# Patient Record
Sex: Female | Born: 2000 | Race: Black or African American | Hispanic: No | Marital: Single | Smoking: Never smoker
Health system: Southern US, Community
[De-identification: ages and names within clinical notes are randomized; demographics above are authoritative.]

## PROBLEM LIST (undated history)

## (undated) DIAGNOSIS — J45909 Unspecified asthma, uncomplicated: Secondary | ICD-10-CM

## (undated) HISTORY — DX: Unspecified asthma, uncomplicated: J45.909

---

## 2021-10-04 ENCOUNTER — Emergency Department (HOSPITAL_COMMUNITY)
Admission: EM | Admit: 2021-10-04 | Discharge: 2021-10-04 | Disposition: A | Discharge status: Home / Self Care | Payer: Medicaid Other | Attending: Emergency Medicine | Admitting: Emergency Medicine

## 2021-10-04 ENCOUNTER — Emergency Department (HOSPITAL_COMMUNITY): Payer: Medicaid Other

## 2021-10-04 ENCOUNTER — Encounter (HOSPITAL_COMMUNITY): Payer: Self-pay

## 2021-10-04 ENCOUNTER — Other Ambulatory Visit: Payer: Self-pay

## 2021-10-04 DIAGNOSIS — R42 Dizziness and giddiness: Secondary | ICD-10-CM | POA: Insufficient documentation

## 2021-10-04 DIAGNOSIS — R519 Headache, unspecified: Secondary | ICD-10-CM | POA: Insufficient documentation

## 2021-10-04 DIAGNOSIS — R030 Elevated blood-pressure reading, without diagnosis of hypertension: Secondary | ICD-10-CM

## 2021-10-04 MED ORDER — IBUPROFEN 200 MG PO TABS
400.0000 mg | ORAL_TABLET | Freq: Once | ORAL | Status: AC
  Administered 2021-10-04: 400 mg via ORAL
  Filled 2021-10-04: qty 2

## 2021-10-04 MED ORDER — ACETAMINOPHEN 500 MG PO TABS
1000.0000 mg | ORAL_TABLET | Freq: Once | ORAL | Status: AC
  Administered 2021-10-04: 1000 mg via ORAL
  Filled 2021-10-04: qty 2

## 2021-10-04 NOTE — Discharge Instructions (Addendum)
It was our pleasure to provide your ER care today - we hope that you feel better.  Our radiologist looked at your head ct and indicates it looks good - no acute abnormality was noted.   Drink plenty of fluids/stay adequately hydrated.   Take excedrin or acetaminophen as need.   Follow up with primary care doctor/neurologist in the coming week.  Also follow up with primary care doctor then for blood pressure which is high tonight.   Return to ER if worse, new symptoms, severe pain, persistent vomiting, numbness/weakness, fainting, fevers, or other concern.

## 2021-10-04 NOTE — ED Triage Notes (Signed)
Pt reports having a headache x 2 months to the front and left side of her head. Pt states that she became dizzy today and fell.

## 2021-10-04 NOTE — ED Provider Notes (Signed)
[facility]-EMERGENCY DEPT Provider Note   CSN: [id number] Arrival date & time: 10/04/21  2014     History Chief Complaint  Patient presents with   Headache    Vanessa Kelley is a 20 y.o. female.  Patient c/o intermittent headaches in the past two months, almost daily. Symptoms gradual onset, moderate, dull, diffuse/frontal, non radiating.  Headaches occur randomly, at rest, occasionally in AM, but also at other times, almost daily. ?hx migraines, but this frequency is unusual for her - occasional headaches in past. No acute, abrupt, or 'worst headache'. No neck pain or stiffness. No eye pain or change in vision. No associated numbness or weakness. No loss of normal functional ability. No vomiting. No sinus drainage, congestion or pain, no sore throat or cough. No fever.chills or sweats.  States mildly lightheaded earlier - denies head trauma or syncope. No vertigo/room spinning or problems w gait.   The history is provided by the patient and medical records.  Headache Associated symptoms: no abdominal pain, no eye pain, no fever, no nausea, no neck pain, no neck stiffness, no numbness, no photophobia, no sinus pressure, no sore throat, no vomiting and no weakness   Fall Associated symptoms include headaches. Pertinent negatives include no chest pain, no abdominal pain and no shortness of breath.      History reviewed. No pertinent past medical history.  There are no problems to display for this patient.   History reviewed. No pertinent surgical history.   OB History   No obstetric history on file.     History reviewed. No pertinent family history.     Home Medications Prior to Admission medications   Not on File    Allergies    Patient has no allergy information on record.  Review of Systems   Review of Systems  Constitutional:  Negative for chills and fever.  HENT:  Negative for sinus pressure, sinus pain and sore throat.   Eyes:   Negative for photophobia, pain, redness and visual disturbance.  Respiratory:  Negative for shortness of breath.   Cardiovascular:  Negative for chest pain.  Gastrointestinal:  Negative for abdominal pain, nausea and vomiting.  Genitourinary:  Negative for dysuria and flank pain.  Musculoskeletal:  Negative for neck pain and neck stiffness.  Skin:  Negative for rash.  Neurological:  Positive for headaches. Negative for speech difficulty, weakness and numbness.  Hematological:  Does not bruise/bleed easily.  Psychiatric/Behavioral:  Negative for confusion.    Physical Exam Updated Vital Signs BP (!) 164/87 (BP Location: Left Arm)   Pulse 72   Temp 97.9 F (36.6 C) (Oral)   Resp 17   Ht 1.702 m (5\' 7" )   Wt 117 kg   SpO2 100%   BMI 40.41 kg/m   Physical Exam Vitals and nursing note reviewed.  Constitutional:      Appearance: Normal appearance. She is well-developed.  HENT:     Head: Atraumatic.     Comments: No sinus or temporal tenderness. No mastoid tenderness.     Right Ear: Tympanic membrane normal.     Left Ear: Tympanic membrane normal.     Nose: Nose normal.     Mouth/Throat:     Mouth: Mucous membranes are moist.  Eyes:     General: No scleral icterus.    Extraocular Movements: Extraocular movements intact.     Conjunctiva/sclera: Conjunctivae normal.     Pupils: Pupils are equal, round, and reactive to light.  Neck:  Vascular: No carotid bruit.     Trachea: No tracheal deviation.     Comments: No stiffness or rigidity. Trachea midline. No mass.  Cardiovascular:     Rate and Rhythm: Normal rate and regular rhythm.     Pulses: Normal pulses.     Heart sounds: Normal heart sounds. No murmur heard.   No friction rub. No gallop.  Pulmonary:     Effort: Pulmonary effort is normal. No respiratory distress.     Breath sounds: Normal breath sounds.  Abdominal:     General: Bowel sounds are normal. There is no distension.     Palpations: Abdomen is soft.      Tenderness: There is no abdominal tenderness.  Genitourinary:    Comments: No cva tenderness.  Musculoskeletal:        General: No swelling.     Cervical back: Normal range of motion and neck supple. No rigidity. No muscular tenderness.     Right lower leg: No edema.     Left lower leg: No edema.  Skin:    General: Skin is warm and dry.     Findings: No rash.  Neurological:     Mental Status: She is alert.     Comments: Alert, speech normal/fluent, no dysarthria or aphasia. Motor fxn grossly intact bil, stre 5/5. Sens grossly intact. Steady gait, no ataxia noted. No faintness noted.   Psychiatric:        Mood and Affect: Mood normal.    ED Results / Procedures / Treatments   Labs (all labs ordered are listed, but only abnormal results are displayed) Labs Reviewed - No data to display  EKG None  Radiology CT Head Wo Contrast  Result Date: 10/04/2021 CLINICAL DATA:  Headache EXAM: CT HEAD WITHOUT CONTRAST TECHNIQUE: Contiguous axial images were obtained from the base of the skull through the vertex without intravenous contrast. COMPARISON:  None. FINDINGS: Brain: No evidence of acute infarction, hemorrhage, hydrocephalus, extra-axial collection or mass lesion/mass effect. Vascular: No hyperdense vessel or unexpected calcification. Skull: Normal. Negative for fracture or focal lesion. Sinuses/Orbits: The visualized paranasal sinuses are essentially clear. The mastoid air cells are unopacified. Other: None. IMPRESSION: Normal head CT. Electronically Signed   By: Julian Hy M.D.   On: 10/04/2021 21:28    Procedures Procedures   Medications Ordered in ED Medications - No data to display  ED Course  I have reviewed the triage vital signs and the nursing notes.  Pertinent labs & imaging results that were available during my care of the patient were reviewed by me and considered in my medical decision making (see chart for details).    MDM Rules/Calculators/A&P                           Imaging ordered.   Reviewed nursing notes and prior charts for additional history.   No meds pta tonight. Acetaminophen po, ibuprofen po. Po fluids.   CT reviewed/interpreted by me - no hem.   Pt currently appears stable for d/c.  Rec pcp/neuro f/u.   Return precautions provided.      Final Clinical Impression(s) / ED Diagnoses Final diagnoses:  None    Rx / DC Orders ED Discharge Orders     None        Lajean Saver, MD 10/04/21 2146

## 2021-10-19 ENCOUNTER — Emergency Department (HOSPITAL_COMMUNITY)
Admission: EM | Admit: 2021-10-19 | Discharge: 2021-10-19 | Disposition: A | Discharge status: Home / Self Care | Payer: Medicaid Other | Attending: Emergency Medicine | Admitting: Emergency Medicine

## 2021-10-19 ENCOUNTER — Other Ambulatory Visit: Payer: Self-pay

## 2021-10-19 DIAGNOSIS — R1013 Epigastric pain: Secondary | ICD-10-CM

## 2021-10-19 DIAGNOSIS — R112 Nausea with vomiting, unspecified: Secondary | ICD-10-CM | POA: Insufficient documentation

## 2021-10-19 DIAGNOSIS — K5900 Constipation, unspecified: Secondary | ICD-10-CM

## 2021-10-19 DIAGNOSIS — R110 Nausea: Secondary | ICD-10-CM

## 2021-10-19 LAB — COMPREHENSIVE METABOLIC PANEL
ALT: 12 U/L (ref 0–44)
AST: 15 U/L (ref 15–41)
Albumin: 3.2 g/dL — ABNORMAL LOW (ref 3.5–5.0)
Alkaline Phosphatase: 77 U/L (ref 38–126)
Anion gap: 8 (ref 5–15)
BUN: 10 mg/dL (ref 6–20)
CO2: 25 mmol/L (ref 22–32)
Calcium: 8.6 mg/dL — ABNORMAL LOW (ref 8.9–10.3)
Chloride: 104 mmol/L (ref 98–111)
Creatinine, Ser: 0.94 mg/dL (ref 0.44–1.00)
GFR, Estimated: >60 mL/min (ref >60)
Glucose, Bld: 133 mg/dL — ABNORMAL HIGH (ref 70–99)
Potassium: 3.5 mmol/L (ref 3.5–5.1)
Sodium: 137 mmol/L (ref 135–145)
Total Bilirubin: 0.5 mg/dL (ref 0.3–1.2)
Total Protein: 6.5 g/dL (ref 6.5–8.1)

## 2021-10-19 LAB — CBC
HCT: 39.3 % (ref 36.0–46.0)
Hemoglobin: 12.8 g/dL (ref 12.0–15.0)
MCH: 28.6 pg (ref 26.0–34.0)
MCHC: 32.6 g/dL (ref 30.0–36.0)
MCV: 87.7 fL (ref 80.0–100.0)
Platelets: 246 10*3/uL (ref 150–400)
RBC: 4.48 MIL/uL (ref 3.87–5.11)
RDW: 11.7 % (ref 11.5–15.5)
WBC: 6.9 10*3/uL (ref 4.0–10.5)
nRBC: 0 % (ref 0.0–0.2)

## 2021-10-19 LAB — URINALYSIS, ROUTINE W REFLEX MICROSCOPIC
Bilirubin Urine: NEGATIVE
Glucose, UA: NEGATIVE mg/dL
Hgb urine dipstick: NEGATIVE
Ketones, ur: NEGATIVE mg/dL
Nitrite: NEGATIVE
Protein, ur: 30 mg/dL — AB
Specific Gravity, Urine: 1.029 (ref 1.005–1.030)
pH: 5 (ref 5.0–8.0)

## 2021-10-19 LAB — I-STAT BETA HCG BLOOD, ED (MC, WL, AP ONLY): I-stat hCG, quantitative: <5 m[IU]/mL (ref <5)

## 2021-10-19 LAB — LIPASE, BLOOD: Lipase: 30 U/L (ref 11–51)

## 2021-10-19 MED ORDER — LACTATED RINGERS IV BOLUS
1000.0000 mL | Freq: Once | INTRAVENOUS | Status: AC
  Administered 2021-10-19: 1000 mL via INTRAVENOUS

## 2021-10-19 MED ORDER — ONDANSETRON 4 MG PO TBDP: 4.0000 mg | ORAL_TABLET | Freq: Three times a day (TID) | ORAL | 0 refills | Status: AC | PRN

## 2021-10-19 MED ORDER — PANTOPRAZOLE SODIUM 40 MG IV SOLR
40.0000 mg | Freq: Once | INTRAVENOUS | Status: AC
  Administered 2021-10-19: 40 mg via INTRAVENOUS
  Filled 2021-10-19: qty 40

## 2021-10-19 MED ORDER — ONDANSETRON HCL 4 MG/2ML IJ SOLN
4.0000 mg | Freq: Once | INTRAMUSCULAR | Status: AC
  Administered 2021-10-19: 4 mg via INTRAVENOUS
  Filled 2021-10-19: qty 2

## 2021-10-19 MED ORDER — PANTOPRAZOLE SODIUM 40 MG PO TBEC: 40.0000 mg | DELAYED_RELEASE_TABLET | Freq: Every day | ORAL | 0 refills | Status: DC

## 2021-10-19 MED ORDER — SENNOSIDES-DOCUSATE SODIUM 8.6-50 MG PO TABS: 1.0000 | ORAL_TABLET | Freq: Every evening | ORAL | 0 refills | Status: DC | PRN

## 2021-10-19 MED ORDER — DICYCLOMINE HCL 10 MG/ML IM SOLN
20.0000 mg | Freq: Once | INTRAMUSCULAR | Status: AC
  Administered 2021-10-19: 20 mg via INTRAMUSCULAR
  Filled 2021-10-19: qty 2

## 2021-10-19 MED ORDER — DICYCLOMINE HCL 20 MG PO TABS: 20.0000 mg | ORAL_TABLET | Freq: Three times a day (TID) | ORAL | 0 refills | Status: DC | PRN

## 2021-10-19 NOTE — ED Notes (Signed)
Dr Long at bedside

## 2021-10-19 NOTE — ED Notes (Signed)
Lab to add on urine culture

## 2021-10-19 NOTE — ED Notes (Signed)
Received verbal report from Erica S RN at this time 

## 2021-10-19 NOTE — Discharge Instructions (Signed)

## 2021-10-19 NOTE — ED Triage Notes (Signed)
Pt c/o abd pain, vomiting and constipation for the past 2 days.

## 2021-10-19 NOTE — ED Provider Notes (Signed)
Emergency Department Provider Note   I have reviewed the triage vital signs and the nursing notes.   HISTORY  Chief Complaint Abdominal Pain and Emesis   HPI Vanessa Kelley is a 20 y.o. female with no past surgical history presents to the emergency department with abdominal pain along with nausea and constipation.  She was recently started on doxycycline and fluconazole with concern for chlamydia infection.  She states that she has developed mainly epigastric abdominal discomfort that radiates more diffusely along with nausea and constipation.  Her last bowel movement was several days ago.  He is not having diarrhea.  She is passing flatus.  She is not having fevers.  No pain into the chest or upper respiratory infection symptoms.  She has noted some dark urine but no dysuria, hesitancy, urgency. No fever/chills or flank pain.    No past medical history on file.  There are no problems to display for this patient.   No past surgical history on file.  Allergies Patient has no known allergies.  No family history on file.  Social History    Review of Systems  Constitutional: No fever/chills Eyes: No visual changes. ENT: No sore throat. Cardiovascular: Denies chest pain. Respiratory: Denies shortness of breath. Gastrointestinal: Positive abdominal pain. Positive nausea, no vomiting.  No diarrhea. Positive constipation. Genitourinary: Negative for dysuria. Musculoskeletal: Negative for back pain. Skin: Negative for rash. Neurological: Negative for headaches, focal weakness or numbness.  10-point ROS otherwise negative.  ____________________________________________   PHYSICAL EXAM:  VITAL SIGNS: ED Triage Vitals  Enc Vitals Group     BP 10/19/21 0210 (!) 150/89     Pulse Rate 10/19/21 0210 81     Resp 10/19/21 0210 18     Temp 10/19/21 0210 98.4 F (36.9 C)     Temp src --      SpO2 10/19/21 0210 98 %     Weight 10/19/21 0214 255 lb (115.7 kg)      Height 10/19/21 0214 5\' 7"  (1.702 m)   Constitutional: Alert and oriented. Well appearing and in no acute distress. Eyes: Conjunctivae are normal.  Head: Atraumatic. Nose: No congestion/rhinnorhea. Mouth/Throat: Mucous membranes are moist.  Neck: No stridor.   Cardiovascular: Normal rate, regular rhythm. Good peripheral circulation. Grossly normal heart sounds.   Respiratory: Normal respiratory effort.  No retractions. Lungs CTAB. Gastrointestinal: Soft with mild mid-epigastric tenderness. No rebound or guarding. No RUQ tenderness. No distention.  Musculoskeletal: No lower extremity tenderness nor edema. No gross deformities of extremities. Neurologic:  Normal speech and language. No gross focal neurologic deficits are appreciated.  Skin:  Skin is warm, dry and intact. No rash noted.   ____________________________________________   LABS (all labs ordered are listed, but only abnormal results are displayed)  Labs Reviewed  COMPREHENSIVE METABOLIC PANEL - Abnormal; Notable for the following components:      Result Value   Glucose, Bld 133 (*)    Calcium 8.6 (*)    Albumin 3.2 (*)    All other components within normal limits  URINALYSIS, ROUTINE W REFLEX MICROSCOPIC - Abnormal; Notable for the following components:   Color, Urine AMBER (*)    APPearance HAZY (*)    Protein, ur 30 (*)    Leukocytes,Ua LARGE (*)    Bacteria, UA RARE (*)    All other components within normal limits  URINE CULTURE  LIPASE, BLOOD  CBC  I-STAT BETA HCG BLOOD, ED ([facility], [facility], [facility] ONLY)   ____________________________________________  RADIOLOGY  None   ____________________________________________   PROCEDURES  Procedure(s) performed:   Procedures  None  ____________________________________________   INITIAL IMPRESSION / ASSESSMENT AND PLAN / ED COURSE  Pertinent labs & imaging results that were available during my care of the patient were reviewed by me and considered in my medical  decision making (see chart for details).   Patient presents emergency department with epigastric abdominal pain along with nausea and constipation.  Question if this could be related to starting recent doxycycline for a sexually transmitted infection.  No peritonitis or lower abdominal pain or other symptoms to strongly suspect PID or TOA.  She looks very well.  She has mild hypertension but no other abnormal vital signs.  Do not have an immediate inclination to perform advanced abdominal imaging.  Plan for IV fluids, symptom management, and will review labs to guide further imaging PRN.   Lab work reviewed.  Pregnancy test is negative.  No leukocytosis.  No acute kidney injury or significant electrolyte disturbance.  UA is equivocal for infection.  She is not having any dysuria, hesitancy, urgency.  We will send this for culture and hold on antibiotics for now. On reassessment, after IV fluids and other medications she is feeling much better.  Her abdomen exam remains benign.  I do not see indication for advanced abdominal imaging at this time.  Will discharge home with supportive care medications and medication for constipation management as well as nausea and PPI medication.  Discussed PCP follow-up plan along with strict ED return precautions. Patient is comfortable with the plan at discharge and tolerating PO here.  ____________________________________________  FINAL CLINICAL IMPRESSION(S) / ED DIAGNOSES  Final diagnoses:  Epigastric pain  Constipation, unspecified constipation type  Nausea     MEDICATIONS GIVEN DURING THIS VISIT:  Medications  lactated ringers bolus 1,000 mL (1,000 mLs Intravenous New Bag/Given 10/19/21 0328)  ondansetron (ZOFRAN) injection 4 mg (4 mg Intravenous Given 10/19/21 0325)  pantoprazole (PROTONIX) injection 40 mg (40 mg Intravenous Given 10/19/21 0322)  dicyclomine (BENTYL) injection 20 mg (20 mg Intramuscular Given 10/19/21 0329)     NEW OUTPATIENT  MEDICATIONS STARTED DURING THIS VISIT:  New Prescriptions   DICYCLOMINE (BENTYL) 20 MG TABLET    Take 1 tablet (20 mg total) by mouth 3 (three) times daily as needed for spasms.   ONDANSETRON (ZOFRAN-ODT) 4 MG DISINTEGRATING TABLET    Take 1 tablet (4 mg total) by mouth every 8 (eight) hours as needed for nausea or vomiting.   PANTOPRAZOLE (PROTONIX) 40 MG TABLET    Take 1 tablet (40 mg total) by mouth daily.   SENNA-DOCUSATE (SENOKOT-S) 8.6-50 MG TABLET    Take 1 tablet by mouth at bedtime as needed for mild constipation or moderate constipation.    Note:  This document was prepared using Dragon voice recognition software and may include unintentional dictation errors.  Alona Bene, MD, [facility] Emergency Medicine    Dorette Hartel, Arlyss Repress, MD 10/19/21 [phone]

## 2021-10-19 NOTE — ED Notes (Signed)
Pt ambulated to restroom without assistance. Pt tolerated PO without any difficulty

## 2021-10-20 LAB — URINE CULTURE

## 2021-10-25 ENCOUNTER — Ambulatory Visit: Payer: Medicaid Other | Admitting: Psychiatry

## 2021-10-25 VITALS — BP 135/74 | HR 78 | Ht 67.0 in | Wt 254.4 lb

## 2021-10-25 DIAGNOSIS — G43109 Migraine with aura, not intractable, without status migrainosus: Secondary | ICD-10-CM

## 2021-10-25 DIAGNOSIS — J45909 Unspecified asthma, uncomplicated: Secondary | ICD-10-CM | POA: Diagnosis not present

## 2021-10-25 MED ORDER — RIZATRIPTAN BENZOATE 10 MG PO TABS: 10.0000 mg | ORAL_TABLET | ORAL | 2 refills | Status: DC | PRN

## 2021-10-25 MED ORDER — TOPIRAMATE 25 MG PO TABS: ORAL_TABLET | ORAL | 3 refills | Status: DC

## 2021-10-25 NOTE — Progress Notes (Signed)
Referring:  No referring provider defined for this encounter.  PCP: Cox, Charletta Cousin, PA-C  Neurology was asked to evaluate Vanessa Kelley, a 20 year old female for a chief complaint of headaches.  Our recommendations of care will be communicated by shared medical record.    CC:  headaches  HPI:  Medical co-morbidities: asthma  The patient presents for evaluation of headaches which began 2 months ago. They typically occur early in the morning or late at night. Did not have headaches prior to this. Headache frequency is variable, she can have multiple headaches in a week or go an entire week without one. Typically has at least 2 migraine days per week. Migraines are described as retro-orbital throbbing pain with associated photophobia and nausea. They can last up to 3 days at a time. She will occasionally see black spots/blurred vision before her headache.  She presented to the ED 10/04/21 for a headache which was so severe she passed out. CTH done at that time was normal.  Excedrin and Tylenol do not help.  Headache History: Onset: 2 months ago Triggers: strong smells Most common time of day for headache to begin: middle of the night Aura: black spots, blurred Location: retro-orbital, bilateral Quality/Description: pounding stabbing Severity: 8-9/10 Associated Symptoms:  Photophobia: yes  Phonophobia: no  Nausea: yes Worse with activity?: yes Duration of headaches: 2-3 days  Headache days per month: 8 Headache free days per month: 22  Current Treatment: Abortive none  Preventative none  Prior Therapies                                 none   Headache Risk Factors: Headache risk factors and/or co-morbidities (+) Neck Pain (+) Obesity  Body mass index is 39.84 kg/m. (-) History of Traumatic Brain Injury and/or Concussion (+) History of Syncope  LABS: CBC    Component Value Date/Time   WBC 6.9 10/19/2021 0226   RBC 4.48 10/19/2021 0226   HGB 12.8 10/19/2021  0226   HCT 39.3 10/19/2021 0226   PLT 246 10/19/2021 0226   MCV 87.7 10/19/2021 0226   MCH 28.6 10/19/2021 0226   MCHC 32.6 10/19/2021 0226   RDW 11.7 10/19/2021 0226   CMP Latest Ref Rng & Units 10/19/2021  Glucose 70 - 99 mg/dL 627(O)  BUN 6 - 20 mg/dL 10  Creatinine 3.50 - 0.93 mg/dL 8.18  Sodium 299 - 371 mmol/L 137  Potassium 3.5 - 5.1 mmol/L 3.5  Chloride 98 - 111 mmol/L 104  CO2 22 - 32 mmol/L 25  Calcium 8.9 - 10.3 mg/dL 6.9(C)  Total Protein 6.5 - 8.1 g/dL 6.5  Total Bilirubin 0.3 - 1.2 mg/dL 0.5  Alkaline Phos 38 - 126 U/L 77  AST 15 - 41 U/L 15  ALT 0 - 44 U/L 12     IMAGING:  CTH 10/04/21: unremarkable  Imaging independently reviewed on October 25, 2021   Current Outpatient Medications on File Prior to Visit  Medication Sig Dispense Refill   dicyclomine (BENTYL) 20 MG tablet Take 1 tablet (20 mg total) by mouth 3 (three) times daily as needed for spasms. 20 tablet 0   ondansetron (ZOFRAN-ODT) 4 MG disintegrating tablet Take 1 tablet (4 mg total) by mouth every 8 (eight) hours as needed for nausea or vomiting. 20 tablet 0   pantoprazole (PROTONIX) 40 MG tablet Take 1 tablet (40 mg total) by mouth daily. 30 tablet 0  senna-docusate (SENOKOT-S) 8.6-50 MG tablet Take 1 tablet by mouth at bedtime as needed for mild constipation or moderate constipation. 30 tablet 0   No current facility-administered medications on file prior to visit.     Allergies: No Known Allergies  Family History: Migraine or other headaches in the family:  no Aneurysms in a first degree relative:  no Brain tumors in the family:  no Other neurological illness in the family:   no  Past Medical History: Asthma  Past Surgical History No past surgical history on file.  Social History:    ROS: Negative for fevers, chills. Positive for headaches. All other systems reviewed and negative unless stated otherwise in HPI.   Physical Exam:   Vital Signs: BP 135/74   Pulse 78   Ht 5'  7" (1.702 m)   Wt 254 lb 6.4 oz (115.4 kg)   BMI 39.84 kg/m  GENERAL: well appearing,in no acute distress,alert SKIN:  Color, texture, turgor normal. No rashes or lesions HEAD:  Normocephalic/atraumatic. CV:  RRR RESP: Normal respiratory effort MSK: +tenderness L>R temple, bilateral occiput, neck, and shoulders  NEUROLOGICAL: Mental Status: Alert, oriented to person, place and time,Follows commands Cranial Nerves: PERRL, unable to visualize fundi due to patient photophobia, visual fields intact to confrontation,extraocular movements intact,facial sensation intact,no facial droop or ptosis,hearing intact to finger rub bilaterally,no dysarthria, shoulder shrug intact and symmetric Motor: muscle strength 5/5 both upper and lower extremities,no drift, normal tone Reflexes: 2+ throughout Sensation: intact to light touch all 4 extremities Coordination: Finger-to- nose-finger intact bilaterally Gait: normal-based   IMPRESSION: 20 year old female with a history of asthma who presents for evaluation of headaches over the past 2 months. CTH earlier this month was normal. Her headache pattern is most consistent with migraine with aura. Will start Topamax for headache prevention and Maxalt for rescue.  PLAN: -Prevention: Start Topamax 25 mg QHS, increase by 25 mg weekly up to 100 mg QHS -Rescue: Start Maxalt 10 mg PRN -next steps: consider neck PT for cervicalgia   I spent a total of 25 minutes chart reviewing and counseling the patient. Headache education was done. Discussed treatment options including preventive and acute medications. Discussed medication overuse headache and to limit use of acute treatments to no more than 2 days/week or 10 days/month. Discussed medication side effects, adverse reactions and drug interactions. Written educational materials and patient instructions outlining all of the above were given.  Follow-up: 3 months   Ocie Doyne, MD 10/25/2021   11:29 AM

## 2021-10-25 NOTE — Patient Instructions (Addendum)
Start Topamax for headache prevention. Take 25 mg (1 pill) at bedtime for one week, then increase to 50 mg (2 pills) at bedtime for one week, then take 75 mg (3 pills) at bedtime for one week, then take 100 mg (4 pills) at bedtime Start Maxalt (rizatriptan) as needed for migraines. Take at the onset of migraine. If headache recurs or does not fully resolve, you may take a second dose after 2 hours. Please avoid taking more than 2 days per week.

## 2022-01-09 ENCOUNTER — Telehealth: Payer: Self-pay | Admitting: Psychiatry

## 2022-01-09 NOTE — Telephone Encounter (Signed)
Appointment rescheduled due to Dr. Billey Gosling being out that week.

## 2022-01-25 ENCOUNTER — Ambulatory Visit: Payer: Medicaid Other | Admitting: Psychiatry

## 2022-01-25 VITALS — BP 122/86 | HR 73 | Ht 67.0 in | Wt 253.0 lb

## 2022-01-25 DIAGNOSIS — M542 Cervicalgia: Secondary | ICD-10-CM

## 2022-01-25 DIAGNOSIS — G43109 Migraine with aura, not intractable, without status migrainosus: Secondary | ICD-10-CM

## 2022-01-25 DIAGNOSIS — G43909 Migraine, unspecified, not intractable, without status migrainosus: Secondary | ICD-10-CM | POA: Insufficient documentation

## 2022-01-25 MED ORDER — TOPIRAMATE 25 MG PO TABS: ORAL_TABLET | ORAL | 3 refills | Status: AC

## 2022-01-25 MED ORDER — RIZATRIPTAN BENZOATE 10 MG PO TABS: 10.0000 mg | ORAL_TABLET | ORAL | 11 refills | Status: DC | PRN

## 2022-01-25 NOTE — Progress Notes (Signed)
? ?  CC:  headaches ? ?Follow-up Visit ? ?Last visit: 10/25/22 ? ?Brief HPI: ?21 year old female with a history of asthma who follows in clinic for migraine with aura.  ? ?At her last visit she was started on Topamax for prevention and Maxalt for rescue. ? ?Interval History: ?Since her last visit she has not noticed much of a change in headaches. She continues to have headaches twice per week. She is taking Topamax 25 mg QHS without side effects. Maxalt resolves her headache and she has not had to take a second dose. It does make her fall asleep, but this is not a bothersome side effect for her. ? ?Continues to have significant neck pain and tension. ? ?Headache days per month: 8 ?Headache free days per month: 22 ? ?Current Headache Regimen: ?Preventative: Topamax 25 mg QHS ?Abortive: Maxalt 10 mg PRN ? ?Prior Therapies                                  ?Topamax 25 mg QHS ?Maxalt 10 mg PRN ? ?Physical Exam:  ? ?Vital Signs: ?BP 122/86   Pulse 73   Ht 5\' 7"  (1.702 m)   Wt 253 lb (114.8 kg)   SpO2 97%   BMI 39.63 kg/m?  ?GENERAL:  well appearing, in no acute distress, alert  ?SKIN:  Color, texture, turgor normal. No rashes or lesions ?HEAD:  Normocephalic/atraumatic. ?RESP: normal respiratory effort ?MSK:  +tenderness to palpation over bilateral neck and shoulders ? ?NEUROLOGICAL: ?Mental Status: Alert, oriented to person, place and time, Follows commands, and Speech fluent and appropriate. ?Cranial Nerves: PERRL, face symmetric, no dysarthria, hearing grossly intact ?Motor: moves all extremities equally ?Gait: normal-based. ? ?IMPRESSION: ?21 year old female with a history of asthma who presents for follow up of migraines. She has not noticed any improvement in her headaches with low dose Topamax. Will increase dose to 50 mg QHS with plan to uptitrate up to 100 mg QHS. Maxalt works well for rescue and she does not mind the sedation she experiences with it. Referral to neck PT placed to help with her  cervicalgia. ? ?PLAN: ?-Prevention: Increase Topamax to 50 mg QHS, uptitrate by 25 mg per week up to 100 mg QHS ?-Rescue: Continue Maxalt 10 mg PRN ?-Referral to neck PT ? ?Follow-up: 3-4 months ? ?I spent a total of 17 minutes on the date of the service. Headache education was done. Discussed treatment options including preventive and acute medications, and physical therapy.  Discussed medication side effects, adverse reactions and drug interactions. Written educational materials and patient instructions outlining all of the above were given. ? ?Genia Harold, MD ?01/25/22 ?11:16 AM ? ?

## 2022-01-25 NOTE — Patient Instructions (Signed)
Increase Topamax to 50 mg (2 pills) at bedtime for one week, then increase to 75 mg (3 pills) at bedtime for one week, then increase to 100 mg (4 pills) at bedtime ?Continue Maxalt as needed for migraines. May repeat a dose in 2 hours if headache persists or reoccurs ?Referral to physical therapy ?

## 2022-02-02 ENCOUNTER — Ambulatory Visit: Payer: Medicaid Other | Admitting: Psychiatry

## 2022-02-08 ENCOUNTER — Ambulatory Visit: Payer: Medicaid Other | Admitting: Physical Therapy

## 2022-02-09 NOTE — Therapy (Addendum)
?OUTPATIENT PHYSICAL THERAPY EVALUATION ? ? ?Patient Name: Vanessa Kelley ?MRN: [id number] ?DOB:August 04, 2001, 21 y.o., female ?Today's Date: 02/13/2022 ? ? PT End of Session - 02/14/22 0809   ? ? Visit Number 1   ? Number of Visits 8   ? Date for PT Re-Evaluation 04/10/22   ? Authorization Type [employer]   ? PT Start Time 1530   ? PT Stop Time 1615   ? PT Time Calculation (min) 45 min   ? Activity Tolerance Patient tolerated treatment well   ? Behavior During Therapy [facility] for tasks assessed/performed   ? ?  ?  ? ?  ? ? ?History reviewed. No pertinent past medical history. ?History reviewed. No pertinent surgical history. ?Patient Active Problem List  ? Diagnosis Date Noted  ? Migraine 01/25/2022  ? Asthma 10/25/2021  ? ? ?PCP: Cox, Tiffany B, PA-C ? ?REFERRING PROVIDER: Ocie Doyne, MD ? ?REFERRING DIAG: Cervicalgia ? ?THERAPY DIAG:  ?Cervicalgia ? ?Muscle weakness (generalized) ? ?Abnormal posture ? ?ONSET DATE: November 2022 ? ?SUBJECTIVE:              ?SUBJECTIVE STATEMENT: ?Patient reports she has been having headaches and her neck is really stiff for several months. She did have an incident where she passed out a few months ago (November) and that is when she was referred to neuro. Neuro referred her to therapy for the neck and headaches. Currently she is getting headaches occasionally due to taking medication which has helped, she has probably had 2-3 over past couple of weeks. She states her neck is still tight and she tries to sit up in a good posture but has difficulty with this. Denies any symptoms into the arms, numbness or tingling. ? ?PERTINENT HISTORY:  ?None ? ?PAIN:  ?Are you having pain?  ?Yes: NPRS scale: 4-5 /10 (8/10 when laying down) ?Pain location: Neck ?Pain description: Tight ?Aggravating factors: Laying down, driving, trying to sit up straight ?Relieving factors: Body pillow and heating pad ? ?PRECAUTIONS: None ? ?WEIGHT BEARING RESTRICTIONS No ? ?FALLS:  ?Has patient fallen in  last 6 months? No ? ?LIVING ENVIRONMENT: ?Lives with:  roommates ? ?OCCUPATION: CNA ? ?PLOF: Independent ? ?PATIENT GOALS: Get rid of headaches and neck tightness ? ? ?OBJECTIVE:  ?DIAGNOSTIC FINDINGS:  ?CT Head 10/04/2021: Normal ? ?PATIENT SURVEYS:  ?NDI 10/50 ? ?COGNITION: ?Overall cognitive status: Within functional limits for tasks assessed ? ?SENSATION: ?WFL ? ?POSTURE:  ?Rounded shoulder and forward head posture ? ? PALPATION: ?Tender to palpation bilateral upper trap region and cervical paraspinals ? ? CERVICAL ROM:  ? ?Active ROM A/PROM (deg) ?02/13/2022  ?Flexion 60  ?Extension 40  ?Right lateral flexion 25  ?Left lateral flexion 25  ?Right rotation 70  ?Left rotation 55  ?Patient reports left>right neck tightness with active motion ? ?UE ROM: ?  UE AROM grossly WFL and non-painful ? ?UE MMT: ? ?MMT Right ?02/13/2022 Left ?02/13/2022  ?Shoulder flexion 5 5  ?Shoulder extension 5 5  ?Shoulder abduction 5 5  ?Shoulder internal rotation 5 5  ?Shoulder external rotation 5 5  ?Periscapular muscles 4-  ? ?CERVICAL SPECIAL TESTS:  ?Neck flexor muscle endurance test: 7 seconds ? ? ?TODAY'S TREATMENT:  ?Supine cervical retraction 10 x 5 sec ?Seated upper trap and levator scap stretch 2 x 15 sec each ?Trigger Point Dry Needling Treatment: ?Pre-treatment instruction: Patient instructed on dry needling rationale, procedures, and possible side effects including pain during treatment (achy,cramping feeling), bruising, drop of blood, lightheadedness, nausea,  sweating. ?Patient Consent Given: Yes ?Education handout provided: No ?Muscles treated: left upper trap, bilateral suboccipitals  ?Needle size and number: .30x73mm x 4 ?Electrical stimulation performed: No ?Parameters: N/A ?Treatment response/outcome: Twitch response elicited and Palpable decrease in muscle tension ?Post-treatment instructions: Patient instructed to expect possible mild to moderate muscle soreness later today and/or tomorrow. Patient instructed in  methods to reduce muscle soreness and to continue prescribed HEP. If patient was dry needled over the lung field, patient was instructed on signs and symptoms of pneumothorax and, however unlikely, to see immediate medical attention should they occur. Patient was also educated on signs and symptoms of infection and to seek medical attention should they occur. Patient verbalized understanding of these instructions and education. ? ? ?PATIENT EDUCATION:  ?Education details: Exam findings, POC, HEP, TPDN ?Person educated: Patient ?Education method: Explanation, Demonstration, Tactile cues, Verbal cues, and Handouts ?Education comprehension: verbalized understanding, returned demonstration, verbal cues required, tactile cues required, and needs further education ? ?HOME EXERCISE PROGRAM: ?Access Code: [email or web address] ? ? ?ASSESSMENT: ?CLINICAL IMPRESSION: ?Patient is a 21 y.o. female who was seen today for physical therapy evaluation and treatment for neck tightness and headaches. She does exhibit posture deficits with increased neck muscle tension resulting in reduced cervical motion and with poor periscapular strength likely contributing to difficulty maintaining proper posture and increased neck tightness and headaches. Performed TPDN treatment this visit for left upper trap and bilateral suboccipitals with good response and reduce muscle tension. Patient provided initial exercise program to begin postural strengthening and endurance, and stretching to reduce muscles tightness and improve cervical motion. ? ? ?OBJECTIVE IMPAIRMENTS decreased ROM, decreased strength, postural dysfunction, and pain.  ? ?ACTIVITY LIMITATIONS driving, occupation, shopping, and school.  ? ?PERSONAL FACTORS Fitness, Past/current experiences, and Time since onset of injury/illness/exacerbation are also affecting patient's functional outcome.  ? ? ?REHAB POTENTIAL: Good ? ?CLINICAL DECISION MAKING: Stable/uncomplicated ? ?EVALUATION COMPLEXITY:  Low ? ? ?GOALS: ?Goals reviewed with patient? Yes ? ?SHORT TERM GOALS: Target date: 03/13/2022 ? ?Patient will be I with initial HEP in order to progress with therapy. ?Baseline: HEP provided at evaluation ?Goal status: INITIAL ? ?2.  Patient will report neck pain and tightness </= 4/10 in order to reduce functional limitations and improve sleeping ability ?Baseline: pain up to 8/10 (4-5/10 at rest) ?Goal status: INITIAL ? ?3.  Patient will exhibit >/= 10 deg improvement in left cervical rotation to improve driving ability ?Baseline: 55 deg left cervical rotation ?Goal status: INITIAL ? ?LONG TERM GOALS: Target date: 04/10/2022 ? ?Patient will be I with final HEP to maintain progress from PT. ?Baseline: HEP provided at evaluation ?Goal status: INITIAL ? ?2.  Patient will report NDI </= 3/50 in order to indicate improved functional ability ?Baseline: 10/50 ?Goal status: INITIAL ? ?3.  Patient will demonstrate periscapular strength >/= 4/5 MMT and DNF endurance >/= 20 sec in order to improve postural control and reduce pain with sitting at school  ?Baseline: periscapular strength 4-/5 MMT, DNF endurance 7 seconds ?Goal status: INITIAL ? ?4.  Patient will report neck pain and tightness </= 2/10 in order to allow for return to all activities without functional limitation ?Baseline: 8/10 at worst, 4-5/10 at rest ?Goal status: INITIAL ? ? ?PLAN: ?PT FREQUENCY: 1-2x/week ? ?PT DURATION: 8 weeks ? ?PLANNED INTERVENTIONS: Therapeutic exercises, Therapeutic activity, Neuromuscular re-education, Balance training, Gait training, Patient/Family education, Joint manipulation, Joint mobilization, Aquatic Therapy, Dry Needling, Electrical stimulation, Spinal manipulation, Spinal mobilization, Cryotherapy, Moist heat, Taping, and Manual therapy ? ?  PLAN FOR NEXT SESSION: Review HEP and progress PRN, manual/dry needling for upper trap/cervical paraspinals/suboccipitals, DNF endurance and postural strengthening ? ? ?Rosana Hoesampbell Keian Odriscoll, PT,  DPT, LAT, ATC ?02/14/22  8:26 AM ?Phone: [phone] ?Fax: [phone] ? ? ? ? ?[employer] Authorization  ? ?Choose one: Rehabilitative ? ?Standardized Assessment or Functional Outcome Tool: See Pain Assessment a

## 2022-02-13 ENCOUNTER — Ambulatory Visit: Payer: Medicaid Other | Attending: Psychiatry | Admitting: Physical Therapy

## 2022-02-13 ENCOUNTER — Other Ambulatory Visit: Payer: Self-pay

## 2022-02-13 DIAGNOSIS — M6281 Muscle weakness (generalized): Secondary | ICD-10-CM | POA: Diagnosis present

## 2022-02-13 DIAGNOSIS — R293 Abnormal posture: Secondary | ICD-10-CM | POA: Diagnosis present

## 2022-02-13 DIAGNOSIS — M542 Cervicalgia: Secondary | ICD-10-CM | POA: Insufficient documentation

## 2022-02-13 NOTE — Patient Instructions (Signed)
Access Code: [email or web address] ?URL: [email or web address] ?Date: 02/13/2022 ?Prepared by: Rosana Hoes ? ?Exercises ?Seated Cervical Sidebending Stretch - 2-3 x daily - 3 reps - 15-20 seconds hold ?Seated Levator Scapulae Stretch - 2-3 x daily - 3 reps - 15-20 seconds hold ?Supine Cervical Retraction with Towel - 2-3 x daily - 10 reps - 5 seconds hold ? ?

## 2022-02-14 ENCOUNTER — Encounter: Payer: Self-pay | Admitting: Physical Therapy

## 2022-02-14 ENCOUNTER — Other Ambulatory Visit: Payer: Self-pay

## 2022-02-22 NOTE — Therapy (Signed)
?OUTPATIENT PHYSICAL THERAPY TREATMENT NOTE ? ? ?Patient Name: Vanessa RabonReAuna Wynette Kelley ?MRN: [id number] ?DOB:11-11-2001, 21 y.o., female ?Today's Date: 02/27/2022 ? ?PCP: Cox, Tiffany B, PA-C ?REFERRING PROVIDER: Cox, Charletta Cousiniffany B, PA-C ? ? PT End of Session - 02/27/22 1537   ? ? Visit Number 2   ? Number of Visits 8   ? Date for PT Re-Evaluation 04/10/22   ? Authorization Type [employer]   ? PT Start Time 1530   ? PT Stop Time 1615   ? PT Time Calculation (min) 45 min   ? Activity Tolerance Patient tolerated treatment well   ? [facility] assessed/performed   ? ?  ?  ? ?  ? ? ?Past Medical History:  ?Diagnosis Date  ? Asthma   ? ?History reviewed. No pertinent surgical history. ?Patient Active Problem List  ? Diagnosis Date Noted  ? Mild intermittent asthma with exacerbation 02/23/2022  ? Intermittent chest pain 02/23/2022  ? Migraine 01/25/2022  ? Asthma 10/25/2021  ? ? ?REFERRING PROVIDER: Ocie Doynehima, Jennifer, MD ?  ?REFERRING DIAG: Cervicalgia ? ?THERAPY DIAG:  ?Cervicalgia ? ?Muscle weakness (generalized) ? ?Abnormal posture ? ?PERTINENT HISTORY: None ? ?PRECAUTIONS: None ? ?SUBJECTIVE: Patient reports improvement since last visit, states neck isn't bothering her as much but shoulders are still tight. ? ?PAIN:  ?Are you having pain?  ?NPRS scale: 4-5 /10 (8/10 when laying down) ?Pain location: Neck ?Pain description: Tight ?Aggravating factors: Laying down, driving, trying to sit up straight ?Relieving factors: Body pillow and heating pad ? ?PATIENT GOALS: Get rid of headaches and neck tightness ? ? ?OBJECTIVE:  ?PATIENT SURVEYS:  ?NDI 10/50 ?  ?POSTURE:  ?Rounded shoulder and forward head posture ?  ? PALPATION: ?Tender to palpation bilateral upper trap region and cervical paraspinals ?  ? CERVICAL ROM:  ?  ?Active ROM A/PROM (deg) ?02/13/2022  ?Flexion 60  ?Extension 40  ?Right lateral flexion 25  ?Left lateral flexion 25  ?Right rotation 70  ?Left rotation 55  ?Patient reports left>right  neck tightness with active motion ?   ?UE MMT: ?  ?MMT Right ?02/13/2022 Left ?02/13/2022  ?Shoulder flexion 5 5  ?Shoulder extension 5 5  ?Shoulder abduction 5 5  ?Shoulder internal rotation 5 5  ?Shoulder external rotation 5 5  ?Periscapular muscles 4-  ?  ?CERVICAL SPECIAL TESTS:  ?Neck flexor muscle endurance test: 7 seconds ?  ?  ?[facility]  [facility]                                                [facility] ?Therapeutic Exercise: ?Seated upper trap and levator scap stretch 2 x 15 sec each ?Prone I 10 x 5 sec ?Supine cervical retraction 10 x 5 sec ?Prone T 10 x 5 sec ?Sidelying thoracic rotation 5 x 5 sec each ?Machine low row 25# x 10 ?Manual: ?Skilled palpation and monitoring of muscle tension while performing TPDN treatment ?Suboccipital release with gentle manual traction  ?Trigger Point Dry Needling Treatment: ?Pre-treatment instruction: Patient instructed on dry needling rationale, procedures, and possible side effects including pain during treatment (achy,cramping feeling), bruising, drop of blood, lightheadedness, nausea, sweating. ?Patient Consent Given: Yes ?Education handout provided: No ?Muscles treated: Bilateral upper traps ?Needle size and number: .30x5050mm x 4 ?Electrical stimulation performed: No ?Parameters: N/A ?Treatment response/outcome: Twitch response elicited  and Palpable decrease in muscle tension and report of improved tightness ?Post-treatment instructions: Patient instructed to expect possible mild to moderate muscle soreness later today and/or tomorrow. Patient instructed in methods to reduce muscle soreness and to continue prescribed HEP. If patient was dry needled over the lung field, patient was instructed on signs and symptoms of pneumothorax and, however unlikely, to see immediate medical attention should they occur. Patient was also educated on signs and symptoms of infection and to seek medical attention should they occur. Patient verbalized  understanding of these instructions and education. ? ? ?[facility] Adult PT Treatment:                                                DATE: 02/13/2022 ?Therapeutic Exercise: ?Supine cervical retraction 10 x 5 sec ?Seated upper trap and levator scap stretch 2 x 15 sec each ?Trigger Point Dry Needling Treatment: ?Pre-treatment instruction: Patient instructed on dry needling rationale, procedures, and possible side effects including pain during treatment (achy,cramping feeling), bruising, drop of blood, lightheadedness, nausea, sweating. ?Patient Consent Given: Yes ?Education handout provided: No ?Muscles treated: left upper trap, bilateral suboccipitals  ?Needle size and number: .30x17mm x 4 ?Electrical stimulation performed: No ?Parameters: N/A ?Treatment response/outcome: Twitch response elicited and Palpable decrease in muscle tension ?Post-treatment instructions: Patient instructed to expect possible mild to moderate muscle soreness later today and/or tomorrow. Patient instructed in methods to reduce muscle soreness and to continue prescribed HEP. If patient was dry needled over the lung field, patient was instructed on signs and symptoms of pneumothorax and, however unlikely, to see immediate medical attention should they occur. Patient was also educated on signs and symptoms of infection and to seek medical attention should they occur. Patient verbalized understanding of these instructions and education. ?  ?PATIENT EDUCATION:  ?Education details: HEP, TPDN ?Person educated: Patient ?Education method: Explanation, Demonstration, Tactile cues, Verbal cues ?Education comprehension: verbalized understanding, returned demonstration, verbal cues required, tactile cues required, and needs further education ?  ?HOME EXERCISE PROGRAM: ?Access Code: [email or web address] ?  ?  ?ASSESSMENT: ?CLINICAL IMPRESSION: ?Patient tolerated therapy well with no adverse effects. Therapy continued with TPDN treatment for bilateral upper traps and  progression of periscapular strength and endurance. She does require cueing to avoid shrug with exercises. Patient seems to be responding well to therapy with reported reduction in neck tightness and headaches. No changes to HEP this visit. Patient would benefit from continued skilled PT to progress postural strengthening and endurance, and stretching to reduce muscles tightness and improve cervical motion. ?  ?  ?OBJECTIVE IMPAIRMENTS decreased ROM, decreased strength, postural dysfunction, and pain.  ?  ?ACTIVITY LIMITATIONS driving, occupation, shopping, and school.  ?  ?PERSONAL FACTORS Fitness, Past/current experiences, and Time since onset of injury/illness/exacerbation are also affecting patient's functional outcome.  ?  ?  ?GOALS: ?Goals reviewed with patient? Yes ?  ?SHORT TERM GOALS: Target date: 03/13/2022 ?  ?Patient will be I with initial HEP in order to progress with therapy. ?Baseline: HEP provided at evaluation ?Goal status: INITIAL ?  ?2.  Patient will report neck pain and tightness </= 4/10 in order to reduce functional limitations and improve sleeping ability ?Baseline: pain up to 8/10 (4-5/10 at rest) ?Goal status: INITIAL ?  ?3.  Patient will exhibit >/= 10 deg improvement in left cervical rotation to improve driving ability ?Baseline: 55 deg left  cervical rotation ?Goal status: INITIAL ?  ?LONG TERM GOALS: Target date: 04/10/2022 ?  ?Patient will be I with final HEP to maintain progress from PT. ?Baseline: HEP provided at evaluation ?Goal status: INITIAL ?  ?2.  Patient will report NDI </= 3/50 in order to indicate improved functional ability ?Baseline: 10/50 ?Goal status: INITIAL ?  ?3.  Patient will demonstrate periscapular strength >/= 4/5 MMT and DNF endurance >/= 20 sec in order to improve postural control and reduce pain with sitting at school  ?Baseline: periscapular strength 4-/5 MMT, DNF endurance 7 seconds ?Goal status: INITIAL ?  ?4.  Patient will report neck pain and tightness </=  2/10 in order to allow for return to all activities without functional limitation ?Baseline: 8/10 at worst, 4-5/10 at rest ?Goal status: INITIAL ?  ?  ?PLAN: ?PT FREQUENCY: 1-2x/week ?  ?PT DURATION: 8 weeks ?  ?PLANNED

## 2022-02-23 ENCOUNTER — Encounter: Payer: Self-pay | Admitting: Nurse Practitioner

## 2022-02-23 ENCOUNTER — Ambulatory Visit (INDEPENDENT_AMBULATORY_CARE_PROVIDER_SITE_OTHER): Payer: Medicaid Other | Admitting: Nurse Practitioner

## 2022-02-23 VITALS — BP 140/73 | HR 81 | Temp 98.2°F | Wt 253.0 lb

## 2022-02-23 DIAGNOSIS — J4521 Mild intermittent asthma with (acute) exacerbation: Secondary | ICD-10-CM

## 2022-02-23 DIAGNOSIS — R079 Chest pain, unspecified: Secondary | ICD-10-CM

## 2022-02-23 MED ORDER — MONTELUKAST SODIUM 10 MG PO TABS: 10.0000 mg | ORAL_TABLET | Freq: Every day | ORAL | 3 refills | Status: AC

## 2022-02-23 MED ORDER — PREDNISONE 20 MG PO TABS
20.0000 mg | ORAL_TABLET | Freq: Every day | ORAL | 0 refills | Status: AC
Start: 2022-02-23 — End: 2022-02-28

## 2022-02-23 MED ORDER — BUDESONIDE-FORMOTEROL FUMARATE 80-4.5 MCG/ACT IN AERO: 2.0000 | INHALATION_SPRAY | Freq: Two times a day (BID) | RESPIRATORY_TRACT | 3 refills | Status: AC

## 2022-02-23 MED ORDER — ALBUTEROL SULFATE HFA 108 (90 BASE) MCG/ACT IN AERS: 2.0000 | INHALATION_SPRAY | Freq: Four times a day (QID) | RESPIRATORY_TRACT | 0 refills | Status: AC | PRN

## 2022-02-23 NOTE — Patient Instructions (Signed)
1. Mild intermittent asthma with exacerbation ? ?- budesonide-formoterol (SYMBICORT) 80-4.5 MCG/ACT inhaler; Inhale 2 puffs into the lungs 2 (two) times daily.  Dispense: 1 each; Refill: 3 ?- montelukast (SINGULAIR) 10 MG tablet; Take 1 tablet (10 mg total) by mouth at bedtime.  Dispense: 30 tablet; Refill: 3 ?- albuterol (VENTOLIN HFA) 108 (90 Base) MCG/ACT inhaler; Inhale 2 puffs into the lungs every 6 (six) hours as needed for wheezing or shortness of breath.  Dispense: 8 g; Refill: 0 ?- predniSONE (DELTASONE) 20 MG tablet; Take 1 tablet (20 mg total) by mouth daily with breakfast for 5 days.  Dispense: 5 tablet; Refill: 0 ? ?2. Intermittent chest pain ? ?- EKG 12-Lead ? ? ?Follow up: ? ?Follow up in 4 weeks follow up for asthma and physical ?

## 2022-02-23 NOTE — Progress Notes (Signed)
@Patient  ID: , female    DOB: 2001-02-19, 21 y.o.   MRN: [id number] ? ?Chief Complaint  ?Patient presents with  ? Establish Care  ?  Pt is here to establish care. Pt stated stabbing chest pain pt think it was her asthma   ? ? ?Referring provider: ?Cox, Tiffany B, PA-C ? ? ?HPI ? ?21 year old female with past history of asthma.  Patient presents today with chest tightness since this past Saturday.  She states that she has been using her old unused prescription of albuterol nebulizers and Qvar.  She was prescribed these when she was 21 years old before she left her pediatrician's office.  She has not had a PCP as an adult.  Patient does need to get in soon for a complete physical.  Patient states that she was wheezing on Saturday.  EKG in office today shows normal sinus rhythm. Denies f/c/s, n/v/d, hemoptysis, PND, chest pain or edema. ? ? ? ? ? ?No Known Allergies ? ? ?There is no immunization history on file for this patient. ? ?Past Medical History:  ?Diagnosis Date  ? Asthma   ? ? ?Tobacco History: ?Social History  ? ?Tobacco Use  ?Smoking Status Never  ?Smokeless Tobacco Never  ? ?Counseling given: Not Answered ? ? ?Outpatient Encounter Medications as of 02/23/2022  ?Medication Sig  ? albuterol (VENTOLIN HFA) 108 (90 Base) MCG/ACT inhaler Inhale 2 puffs into the lungs every 6 (six) hours as needed for wheezing or shortness of breath.  ? budesonide-formoterol (SYMBICORT) 80-4.5 MCG/ACT inhaler Inhale 2 puffs into the lungs 2 (two) times daily.  ? montelukast (SINGULAIR) 10 MG tablet Take 1 tablet (10 mg total) by mouth at bedtime.  ? predniSONE (DELTASONE) 20 MG tablet Take 1 tablet (20 mg total) by mouth daily with breakfast for 5 days.  ? rizatriptan (MAXALT) 10 MG tablet Take 1 tablet (10 mg total) by mouth as needed for migraine. May repeat in 2 hours if needed  ? topiramate (TOPAMAX) 25 MG tablet Take 50 mg (2 pills) at bedtime for one week, then take 75 mg (3 pills) at bedtime for one  week, then take 100 mg (4 pills) at bedtime  ? dicyclomine (BENTYL) 20 MG tablet Take 1 tablet (20 mg total) by mouth 3 (three) times daily as needed for spasms. (Patient not taking: Reported on 02/23/2022)  ? ondansetron (ZOFRAN-ODT) 4 MG disintegrating tablet Take 1 tablet (4 mg total) by mouth every 8 (eight) hours as needed for nausea or vomiting. (Patient not taking: Reported on 02/23/2022)  ? pantoprazole (PROTONIX) 40 MG tablet Take 1 tablet (40 mg total) by mouth daily.  ? senna-docusate (SENOKOT-S) 8.6-50 MG tablet Take 1 tablet by mouth at bedtime as needed for mild constipation or moderate constipation. (Patient not taking: Reported on 02/23/2022)  ? ?No facility-administered encounter medications on file as of 02/23/2022.  ? ? ? ?Review of Systems ? ?Review of Systems  ?Constitutional: Negative.   ?HENT: Negative.    ?Respiratory:  Positive for chest tightness, shortness of breath and wheezing.   ?Cardiovascular: Negative.   ?Gastrointestinal: Negative.   ?Allergic/Immunologic: Negative.   ?Neurological: Negative.   ?Psychiatric/Behavioral: Negative.     ? ? ? ?Physical Exam ? ?BP 140/73 (BP Location: Right Arm, Patient Position: Sitting, Cuff Size: Large)   Pulse 81   Temp 98.2 ?F (36.8 ?C)   Wt 253 lb 0.2 oz (114.8 kg)   SpO2 100%   BMI 39.63 kg/m?  ? ?Wt Readings from  Last 5 Encounters:  ?02/23/22 253 lb 0.2 oz (114.8 kg)  ?01/25/22 253 lb (114.8 kg)  ?10/25/21 254 lb 6.4 oz (115.4 kg)  ?10/19/21 255 lb (115.7 kg)  ?10/04/21 258 lb (117 kg)  ? ? ? ?Physical Exam ?Vitals and nursing note reviewed.  ?Constitutional:   ?   General: She is not in acute distress. ?   Appearance: She is well-developed.  ?Cardiovascular:  ?   Rate and Rhythm: Normal rate and regular rhythm.  ?Pulmonary:  ?   Effort: Pulmonary effort is normal.  ?   Breath sounds: Normal breath sounds. Decreased air movement present.  ?Neurological:  ?   Mental Status: She is alert and oriented to person, place, and time.  ? ? ? ?Lab  Results: ? ?CBC ?   ?Component Value Date/Time  ? WBC 6.9 10/19/2021 0226  ? RBC 4.48 10/19/2021 0226  ? HGB 12.8 10/19/2021 0226  ? HCT 39.3 10/19/2021 0226  ? PLT 246 10/19/2021 0226  ? MCV 87.7 10/19/2021 0226  ? MCH 28.6 10/19/2021 0226  ? MCHC 32.6 10/19/2021 0226  ? RDW 11.7 10/19/2021 0226  ? ? ?BMET ?   ?Component Value Date/Time  ? NA 137 10/19/2021 0226  ? K 3.5 10/19/2021 0226  ? CL 104 10/19/2021 0226  ? CO2 25 10/19/2021 0226  ? GLUCOSE 133 (H) 10/19/2021 0226  ? BUN 10 10/19/2021 0226  ? CREATININE 0.94 10/19/2021 0226  ? CALCIUM 8.6 (L) 10/19/2021 0226  ? GFRNONAA >60 10/19/2021 0226  ? ? ?BNP ?No results found for: BNP ? ?ProBNP ?No results found for: PROBNP ? ?Imaging: ?No results found. ? ? ?Assessment & Plan:  ? ?Mild intermittent asthma with exacerbation ?- budesonide-formoterol (SYMBICORT) 80-4.5 MCG/ACT inhaler; Inhale 2 puffs into the lungs 2 (two) times daily.  Dispense: 1 each; Refill: 3 ?- montelukast (SINGULAIR) 10 MG tablet; Take 1 tablet (10 mg total) by mouth at bedtime.  Dispense: 30 tablet; Refill: 3 ?- albuterol (VENTOLIN HFA) 108 (90 Base) MCG/ACT inhaler; Inhale 2 puffs into the lungs every 6 (six) hours as needed for wheezing or shortness of breath.  Dispense: 8 g; Refill: 0 ?- predniSONE (DELTASONE) 20 MG tablet; Take 1 tablet (20 mg total) by mouth daily with breakfast for 5 days.  Dispense: 5 tablet; Refill: 0 ? ?2. Intermittent chest pain ? ?- EKG 12-Lead ? ? ?Follow up: ? ?Follow up in 4 weeks follow up for asthma and physical ? ? ? ? ?Ivonne Andrew, NP ?02/23/2022 ? ?

## 2022-02-23 NOTE — Assessment & Plan Note (Signed)
-   budesonide-formoterol (SYMBICORT) 80-4.5 MCG/ACT inhaler; Inhale 2 puffs into the lungs 2 (two) times daily.  Dispense: 1 each; Refill: 3 ?- montelukast (SINGULAIR) 10 MG tablet; Take 1 tablet (10 mg total) by mouth at bedtime.  Dispense: 30 tablet; Refill: 3 ?- albuterol (VENTOLIN HFA) 108 (90 Base) MCG/ACT inhaler; Inhale 2 puffs into the lungs every 6 (six) hours as needed for wheezing or shortness of breath.  Dispense: 8 g; Refill: 0 ?- predniSONE (DELTASONE) 20 MG tablet; Take 1 tablet (20 mg total) by mouth daily with breakfast for 5 days.  Dispense: 5 tablet; Refill: 0 ? ?2. Intermittent chest pain ? ?- EKG 12-Lead ? ? ?Follow up: ? ?Follow up in 4 weeks follow up for asthma and physical ?

## 2022-02-27 ENCOUNTER — Ambulatory Visit: Payer: Medicaid Other | Attending: Psychiatry | Admitting: Physical Therapy

## 2022-02-27 ENCOUNTER — Encounter: Payer: Self-pay | Admitting: Physical Therapy

## 2022-02-27 ENCOUNTER — Other Ambulatory Visit: Payer: Self-pay

## 2022-02-27 DIAGNOSIS — M542 Cervicalgia: Secondary | ICD-10-CM | POA: Insufficient documentation

## 2022-02-27 DIAGNOSIS — R293 Abnormal posture: Secondary | ICD-10-CM | POA: Diagnosis present

## 2022-02-27 DIAGNOSIS — M6281 Muscle weakness (generalized): Secondary | ICD-10-CM | POA: Insufficient documentation

## 2022-03-06 ENCOUNTER — Encounter: Payer: Medicaid Other | Admitting: Physical Therapy

## 2022-03-06 NOTE — Therapy (Incomplete)
?OUTPATIENT PHYSICAL THERAPY TREATMENT NOTE ? ? ?Patient Name: Vanessa RabonReAuna Wynette Kelley ?MRN: [id number] ?DOB:2001/09/02, 21 y.o., female ?Today's Date: 03/06/2022 ? ?PCP: Cox, Tiffany B, PA-C ?REFERRING PROVIDER: Cox, Tiffany B, PA-C ? ? ? ? ?Past Medical History:  ?Diagnosis Date  ? Asthma   ? ?No past surgical history on file. ?Patient Active Problem List  ? Diagnosis Date Noted  ? Mild intermittent asthma with exacerbation 02/23/2022  ? Intermittent chest pain 02/23/2022  ? Migraine 01/25/2022  ? Asthma 10/25/2021  ? ? ?REFERRING PROVIDER: Ocie Doynehima, Jennifer, MD ?  ?REFERRING DIAG: Cervicalgia ? ?THERAPY DIAG:  ?No diagnosis found. ? ?PERTINENT HISTORY: None ? ?PRECAUTIONS: None ? ?SUBJECTIVE: Patient reports improvement since last visit, states neck isn't bothering her as much but shoulders are still tight. ? ?PAIN:  ?Are you having pain?  ?NPRS scale: 4-5 /10 (8/10 when laying down) ?Pain location: Neck ?Pain description: Tight ?Aggravating factors: Laying down, driving, trying to sit up straight ?Relieving factors: Body pillow and heating pad ? ?PATIENT GOALS: Get rid of headaches and neck tightness ? ? ?OBJECTIVE:  ?PATIENT SURVEYS:  ?NDI 10/50 ?  ?POSTURE:  ?Rounded shoulder and forward head posture ?  ? PALPATION: ?Tender to palpation bilateral upper trap region and cervical paraspinals ?  ? CERVICAL ROM:  ?  ?Active ROM A/PROM (deg) ?02/13/2022  ?Flexion 60  ?Extension 40  ?Right lateral flexion 25  ?Left lateral flexion 25  ?Right rotation 70  ?Left rotation 55  ?Patient reports left>right neck tightness with active motion ?   ?UE MMT: ?  ?MMT Right ?02/13/2022 Left ?02/13/2022  ?Shoulder flexion 5 5  ?Shoulder extension 5 5  ?Shoulder abduction 5 5  ?Shoulder internal rotation 5 5  ?Shoulder external rotation 5 5  ?Periscapular muscles 4-  ?  ?CERVICAL SPECIAL TESTS:  ?Neck flexor muscle endurance test: 7 seconds ?  ?  ?[facility]  [facility]                                                 [facility]: [facility] ?Therapeutic Exercise: ?Seated upper trap and levator scap stretch 2 x 15 sec each ?Prone I 10 x 5 sec ?Supine cervical retraction 10 x 5 sec ?Prone T 10 x 5 sec ?Sidelying thoracic rotation 5 x 5 sec each ?Machine low row 25# x 10 ?Manual: ?Skilled palpation and monitoring of muscle tension while performing TPDN treatment ?Suboccipital release with gentle manual traction  ? ? ? ?[facility]                                                [facility]: 02/27/2022 ?Therapeutic Exercise: ?Seated upper trap and levator scap stretch 2 x 15 sec each ?Prone I 10 x 5 sec ?Supine cervical retraction 10 x 5 sec ?Prone T 10 x 5 sec ?Sidelying thoracic rotation 5 x 5 sec each ?Machine low row 25# x 10 ?Manual: ?Skilled palpation and monitoring of muscle tension while performing TPDN treatment ?Suboccipital release with gentle manual traction  ?Trigger Point Dry Needling Treatment: ?Pre-treatment instruction: Patient instructed on dry needling rationale, procedures, and possible side effects including pain during treatment (achy,cramping feeling), bruising, drop of blood, lightheadedness, nausea, sweating. ?Patient Consent  Given: Yes ?Education handout provided: No ?Muscles treated: Bilateral upper traps ?Needle size and number: .30x58mm x 4 ?Electrical stimulation performed: No ?Parameters: N/A ?Treatment response/outcome: Twitch response elicited and Palpable decrease in muscle tension and report of improved tightness ?Post-treatment instructions: Patient instructed to expect possible mild to moderate muscle soreness later today and/or tomorrow. Patient instructed in methods to reduce muscle soreness and to continue prescribed HEP. If patient was dry needled over the lung field, patient was instructed on signs and symptoms of pneumothorax and, however unlikely, to see immediate medical attention should they occur. Patient was also educated on signs and symptoms of infection and to seek medical attention  should they occur. Patient verbalized understanding of these instructions and education. ? ? ?[facility] Adult PT Treatment:                                                DATE: 02/13/2022 ?Therapeutic Exercise: ?Supine cervical retraction 10 x 5 sec ?Seated upper trap and levator scap stretch 2 x 15 sec each ?Trigger Point Dry Needling Treatment: ?Pre-treatment instruction: Patient instructed on dry needling rationale, procedures, and possible side effects including pain during treatment (achy,cramping feeling), bruising, drop of blood, lightheadedness, nausea, sweating. ?Patient Consent Given: Yes ?Education handout provided: No ?Muscles treated: left upper trap, bilateral suboccipitals  ?Needle size and number: .30x91mm x 4 ?Electrical stimulation performed: No ?Parameters: N/A ?Treatment response/outcome: Twitch response elicited and Palpable decrease in muscle tension ?Post-treatment instructions: Patient instructed to expect possible mild to moderate muscle soreness later today and/or tomorrow. Patient instructed in methods to reduce muscle soreness and to continue prescribed HEP. If patient was dry needled over the lung field, patient was instructed on signs and symptoms of pneumothorax and, however unlikely, to see immediate medical attention should they occur. Patient was also educated on signs and symptoms of infection and to seek medical attention should they occur. Patient verbalized understanding of these instructions and education. ?  ?PATIENT EDUCATION:  ?Education details: HEP, TPDN ?Person educated: Patient ?Education method: Explanation, Demonstration, Tactile cues, Verbal cues ?Education comprehension: verbalized understanding, returned demonstration, verbal cues required, tactile cues required, and needs further education ?  ?HOME EXERCISE PROGRAM: ?Access Code: [email or web address] ?  ?  ?ASSESSMENT: ?CLINICAL IMPRESSION: ?Patient tolerated therapy well with no adverse effects. *** Patient would benefit from  continued skilled PT to progress postural strengthening and endurance, and stretching to reduce muscles tightness and improve cervical motion. ? ?Therapy continued with TPDN treatment for bilateral upper traps and progression of periscapular strength and endurance. She does require cueing to avoid shrug with exercises. Patient seems to be responding well to therapy with reported reduction in neck tightness and headaches. No changes to HEP this visit.  ?  ?  ?OBJECTIVE IMPAIRMENTS decreased ROM, decreased strength, postural dysfunction, and pain.  ?  ?ACTIVITY LIMITATIONS driving, occupation, shopping, and school.  ?  ?PERSONAL FACTORS Fitness, Past/current experiences, and Time since onset of injury/illness/exacerbation are also affecting patient's functional outcome.  ?  ?  ?GOALS: ?Goals reviewed with patient? Yes ?  ?SHORT TERM GOALS: Target date: 03/13/2022 ?  ?Patient will be I with initial HEP in order to progress with therapy. ?Baseline: HEP provided at evaluation ?Goal status: INITIAL ?  ?2.  Patient will report neck pain and tightness </= 4/10 in order to reduce functional limitations and improve sleeping ability ?Baseline: pain  up to 8/10 (4-5/10 at rest) ?Goal status: INITIAL ?  ?3.  Patient will exhibit >/= 10 deg improvement in left cervical rotation to improve driving ability ?Baseline: 55 deg left cervical rotation ?Goal status: INITIAL ?  ?LONG TERM GOALS: Target date: 04/10/2022 ?  ?Patient will be I with final HEP to maintain progress from PT. ?Baseline: HEP provided at evaluation ?Goal status: INITIAL ?  ?2.  Patient will report NDI </= 3/50 in order to indicate improved functional ability ?Baseline: 10/50 ?Goal status: INITIAL ?  ?3.  Patient will demonstrate periscapular strength >/= 4/5 MMT and DNF endurance >/= 20 sec in order to improve postural control and reduce pain with sitting at school  ?Baseline: periscapular strength 4-/5 MMT, DNF endurance 7 seconds ?Goal status: INITIAL ?  ?4.   Patient will report neck pain and tightness </= 2/10 in order to allow for return to all activities without functional limitation ?Baseline: 8/10 at worst, 4-5/10 at rest ?Goal status: INITIAL ?  ?  ?PLAN: ?PT FREQU

## 2022-03-13 ENCOUNTER — Ambulatory Visit: Payer: Medicaid Other | Admitting: Physical Therapy

## 2022-03-13 NOTE — Therapy (Incomplete)
?OUTPATIENT PHYSICAL THERAPY TREATMENT NOTE ? ? ?Patient Name: Vanessa Kelley ?MRN: [id number] ?DOB:June 05, 2001, 21 y.o., female ?Today's Date: 03/13/2022 ? ?PCP: Cox, Tiffany B, PA-C ?REFERRING PROVIDER: Cox, Tiffany B, PA-C ? ? ? ? ?Past Medical History:  ?Diagnosis Date  ? Asthma   ? ?No past surgical history on file. ?Patient Active Problem List  ? Diagnosis Date Noted  ? Mild intermittent asthma with exacerbation 02/23/2022  ? Intermittent chest pain 02/23/2022  ? Migraine 01/25/2022  ? Asthma 10/25/2021  ? ? ?REFERRING PROVIDER: Ocie Doyne, MD ?  ?REFERRING DIAG: Cervicalgia ? ?THERAPY DIAG:  ?No diagnosis found. ? ?PERTINENT HISTORY: None ? ?PRECAUTIONS: None ? ?SUBJECTIVE: Patient reports improvement since last visit, states neck isn't bothering her as much but shoulders are still tight. ? ?PAIN:  ?Are you having pain?  ?NPRS scale: 4-5 /10 (8/10 when laying down) ?Pain location: Neck ?Pain description: Tight ?Aggravating factors: Laying down, driving, trying to sit up straight ?Relieving factors: Body pillow and heating pad ? ?PATIENT GOALS: Get rid of headaches and neck tightness ? ? ?OBJECTIVE:  ?PATIENT SURVEYS:  ?NDI 10/50 ?  ?POSTURE:  ?Rounded shoulder and forward head posture ?  ? PALPATION: ?Tender to palpation bilateral upper trap region and cervical paraspinals ?  ? CERVICAL ROM:  ?  ?Active ROM A/PROM (deg) ?02/13/2022  ?Flexion 60  ?Extension 40  ?Right lateral flexion 25  ?Left lateral flexion 25  ?Right rotation 70  ?Left rotation 55  ?Patient reports left>right neck tightness with active motion ?   ?UE MMT: ?  ?MMT Right ?02/13/2022 Left ?02/13/2022  ?Shoulder flexion 5 5  ?Shoulder extension 5 5  ?Shoulder abduction 5 5  ?Shoulder internal rotation 5 5  ?Shoulder external rotation 5 5  ?Periscapular muscles 4-  ?  ?CERVICAL SPECIAL TESTS:  ?Neck flexor muscle endurance test: 7 seconds ?  ?  ?TODAY'S TREATMENT:  ?[facility] Adult PT Treatment:                                                 DATE: 03/13/2022 ?Therapeutic Exercise: ?Seated upper trap and levator scap stretch 2 x 15 sec each ?Prone I 10 x 5 sec ?Supine cervical retraction 10 x 5 sec ?Prone T 10 x 5 sec ?Sidelying thoracic rotation 5 x 5 sec each ?Machine low row 25# x 10 ?Manual: ?Skilled palpation and monitoring of muscle tension while performing TPDN treatment ?Suboccipital release with gentle manual traction  ? ? ?[facility] Adult PT Treatment:                                                DATE: 02/27/2022 ?Therapeutic Exercise: ?Seated upper trap and levator scap stretch 2 x 15 sec each ?Prone I 10 x 5 sec ?Supine cervical retraction 10 x 5 sec ?Prone T 10 x 5 sec ?Sidelying thoracic rotation 5 x 5 sec each ?Machine low row 25# x 10 ?Manual: ?Skilled palpation and monitoring of muscle tension while performing TPDN treatment ?Suboccipital release with gentle manual traction  ?Trigger Point Dry Needling Treatment: ?Pre-treatment instruction: Patient instructed on dry needling rationale, procedures, and possible side effects including pain during treatment (achy,cramping feeling), bruising, drop of blood, lightheadedness, nausea, sweating. ?Patient Consent Given:  Yes ?Education handout provided: No ?Muscles treated: Bilateral upper traps ?Needle size and number: .30x8mm x 4 ?Electrical stimulation performed: No ?Parameters: N/A ?Treatment response/outcome: Twitch response elicited and Palpable decrease in muscle tension and report of improved tightness ?Post-treatment instructions: Patient instructed to expect possible mild to moderate muscle soreness later today and/or tomorrow. Patient instructed in methods to reduce muscle soreness and to continue prescribed HEP. If patient was dry needled over the lung field, patient was instructed on signs and symptoms of pneumothorax and, however unlikely, to see immediate medical attention should they occur. Patient was also educated on signs and symptoms of infection and to seek medical attention should  they occur. Patient verbalized understanding of these instructions and education. ? ?[facility] Adult PT Treatment:                                                DATE: 02/13/2022 ?Therapeutic Exercise: ?Supine cervical retraction 10 x 5 sec ?Seated upper trap and levator scap stretch 2 x 15 sec each ?Trigger Point Dry Needling Treatment: ?Pre-treatment instruction: Patient instructed on dry needling rationale, procedures, and possible side effects including pain during treatment (achy,cramping feeling), bruising, drop of blood, lightheadedness, nausea, sweating. ?Patient Consent Given: Yes ?Education handout provided: No ?Muscles treated: left upper trap, bilateral suboccipitals  ?Needle size and number: .30x76mm x 4 ?Electrical stimulation performed: No ?Parameters: N/A ?Treatment response/outcome: Twitch response elicited and Palpable decrease in muscle tension ?Post-treatment instructions: Patient instructed to expect possible mild to moderate muscle soreness later today and/or tomorrow. Patient instructed in methods to reduce muscle soreness and to continue prescribed HEP. If patient was dry needled over the lung field, patient was instructed on signs and symptoms of pneumothorax and, however unlikely, to see immediate medical attention should they occur. Patient was also educated on signs and symptoms of infection and to seek medical attention should they occur. Patient verbalized understanding of these instructions and education. ?  ?PATIENT EDUCATION:  ?Education details: HEP, TPDN ?Person educated: Patient ?Education method: Explanation, Demonstration, Tactile cues, Verbal cues ?Education comprehension: verbalized understanding, returned demonstration, verbal cues required, tactile cues required, and needs further education ?  ?HOME EXERCISE PROGRAM: ?Access Code: [email or web address] ?  ?  ?ASSESSMENT: ?CLINICAL IMPRESSION: ?Patient tolerated therapy well with no adverse effects. *** Patient would benefit from continued  skilled PT to progress postural strengthening and endurance, and stretching to reduce muscles tightness and improve cervical motion. ? ?Therapy continued with TPDN treatment for bilateral upper traps and progression of periscapular strength and endurance. She does require cueing to avoid shrug with exercises. Patient seems to be responding well to therapy with reported reduction in neck tightness and headaches. No changes to HEP this visit.  ?  ?  ?OBJECTIVE IMPAIRMENTS decreased ROM, decreased strength, postural dysfunction, and pain.  ?  ?ACTIVITY LIMITATIONS driving, occupation, shopping, and school.  ?  ?PERSONAL FACTORS Fitness, Past/current experiences, and Time since onset of injury/illness/exacerbation are also affecting patient's functional outcome.  ?  ?  ?GOALS: ?Goals reviewed with patient? Yes ?  ?SHORT TERM GOALS: Target date: 03/13/2022 ?  ?Patient will be I with initial HEP in order to progress with therapy. ?Baseline: HEP provided at evaluation ?Goal status: INITIAL ?  ?2.  Patient will report neck pain and tightness </= 4/10 in order to reduce functional limitations and improve sleeping ability ?Baseline: pain up to  8/10 (4-5/10 at rest) ?Goal status: INITIAL ?  ?3.  Patient will exhibit >/= 10 deg improvement in left cervical rotation to improve driving ability ?Baseline: 55 deg left cervical rotation ?Goal status: INITIAL ?  ?LONG TERM GOALS: Target date: 04/10/2022 ?  ?Patient will be I with final HEP to maintain progress from PT. ?Baseline: HEP provided at evaluation ?Goal status: INITIAL ?  ?2.  Patient will report NDI </= 3/50 in order to indicate improved functional ability ?Baseline: 10/50 ?Goal status: INITIAL ?  ?3.  Patient will demonstrate periscapular strength >/= 4/5 MMT and DNF endurance >/= 20 sec in order to improve postural control and reduce pain with sitting at school  ?Baseline: periscapular strength 4-/5 MMT, DNF endurance 7 seconds ?Goal status: INITIAL ?  ?4.  Patient will  report neck pain and tightness </= 2/10 in order to allow for return to all activities without functional limitation ?Baseline: 8/10 at worst, 4-5/10 at rest ?Goal status: INITIAL ?  ?  ?PLAN: ?PT FREQUENCY

## 2022-03-20 ENCOUNTER — Ambulatory Visit: Payer: Medicaid Other | Admitting: Physical Therapy

## 2022-03-20 ENCOUNTER — Encounter: Payer: Self-pay | Admitting: Physical Therapy

## 2022-03-20 ENCOUNTER — Other Ambulatory Visit: Payer: Self-pay

## 2022-03-20 DIAGNOSIS — R293 Abnormal posture: Secondary | ICD-10-CM

## 2022-03-20 DIAGNOSIS — M6281 Muscle weakness (generalized): Secondary | ICD-10-CM

## 2022-03-20 DIAGNOSIS — M542 Cervicalgia: Secondary | ICD-10-CM | POA: Diagnosis not present

## 2022-03-20 NOTE — Therapy (Signed)
?OUTPATIENT PHYSICAL THERAPY TREATMENT NOTE ? ? ?Patient Name: Vanessa Kelley ?MRN: [id number] ?DOB:07-13-2001, 21 y.o., female ?Today's Date: 03/20/2022 ? ?PCP: Cox, Tiffany B, PA-C ?REFERRING PROVIDER: Cox, Gaetano Hawthorne, PA-C ? ? PT End of Session - 03/20/22 1535   ? ? Visit Number 3   ? Number of Visits 8   ? Date for PT Re-Evaluation 04/10/22   ? Authorization Type [facility] [employer]   ? Authorization Time Period 02/14/2022 - 04/15/2022   ? Authorization - Visit Number 3   ? Authorization - Number of Visits 10   ? PT Start Time 1530   ? PT Stop Time 1610   ? PT Time Calculation (min) 40 min   ? Activity Tolerance Patient tolerated treatment well   ? Behavior During Therapy [facility] for tasks assessed/performed   ? ?  ?  ? ?  ? ? ? ?Past Medical History:  ?Diagnosis Date  ? Asthma   ? ?History reviewed. No pertinent surgical history. ?Patient Active Problem List  ? Diagnosis Date Noted  ? Mild intermittent asthma with exacerbation 02/23/2022  ? Intermittent chest pain 02/23/2022  ? Migraine 01/25/2022  ? Asthma 10/25/2021  ? ? ?REFERRING PROVIDER: Genia Harold, MD ?  ?REFERRING DIAG: Cervicalgia ? ?THERAPY DIAG:  ?Cervicalgia ? ?Muscle weakness (generalized) ? ?Abnormal posture ? ?PERTINENT HISTORY: None ? ?PRECAUTIONS: None ? ?SUBJECTIVE: Patient reports she is doing well. She states her neck is doing well and the tightness has improved. She has had some headaches that she believes are from stress. She states over the past 2 weeks she has had 3-4 headaches.  ? ?PAIN:  ?Are you having pain?  ?NPRS scale: 0 /10 (3-4 with activity) ?Pain location: Neck ?Pain description: Tight ?Aggravating factors: Laying down, driving, trying to sit up straight, turning her neck, lifting something heavy ?Relieving factors: Body pillow and heating pad ? ?PATIENT GOALS: Get rid of headaches and neck tightness ? ? ?OBJECTIVE:  ?PATIENT SURVEYS:  ?NDI 10/50 ?  ?POSTURE:  ?Rounded shoulder and forward head posture ?  ? PALPATION: ?Tender to  palpation bilateral upper trap region and cervical paraspinals ?  ? CERVICAL ROM:  ?  ?Active ROM A/PROM (deg) ?02/13/2022  ? ?03/20/2022  ?Flexion 60   ?Extension 40   ?Right lateral flexion 25 30  ?Left lateral flexion 25 30  ?Right rotation 70   ?Left rotation 55 60  ?Patient reports left>right neck tightness with active motion ?   ?UE MMT: ?  ?MMT Right ?02/13/2022 Left ?02/13/2022  ?Shoulder flexion 5 5  ?Shoulder extension 5 5  ?Shoulder abduction 5 5  ?Shoulder internal rotation 5 5  ?Shoulder external rotation 5 5  ?Periscapular muscles 4-  ?  ?CERVICAL SPECIAL TESTS:  ?Neck flexor muscle endurance test: 7 seconds ?  ?  ?TODAY'S TREATMENT:  ?[facility] Adult PT Treatment:                                                DATE: 03/20/2022 ?Therapeutic Exercise: ?UBE L4 x 4 min (2 fwd/bwd) while taking subjective ?Supine cervical retraction 10 x 5 sec, with peanut ball places under neck 10 x 5 sec ?Sidelying thoracic rotation 10 x 5 sec each ?Row with blue 2 x 15 ?Double ER and scap retraction with blue 2 x 10 ?Manual: ?Suboccipital release with gentle manual traction x 5 bouts ?  Passive upper trap and levator scap stretching ? ? ?[facility] Adult PT Treatment:                                                DATE: 02/27/2022 ?Therapeutic Exercise: ?Seated upper trap and levator scap stretch 2 x 15 sec each ?Prone I 10 x 5 sec ?Supine cervical retraction 10 x 5 sec ?Prone T 10 x 5 sec ?Sidelying thoracic rotation 5 x 5 sec each ?Machine low row 25# x 10 ?Manual: ?Skilled palpation and monitoring of muscle tension while performing TPDN treatment ?Suboccipital release with gentle manual traction  ?Trigger Point Dry Needling Treatment: ?Pre-treatment instruction: Patient instructed on dry needling rationale, procedures, and possible side effects including pain during treatment (achy,cramping feeling), bruising, drop of blood, lightheadedness, nausea, sweating. ?Patient Consent Given: Yes ?Education handout provided: No ?Muscles treated:  Bilateral upper traps ?Needle size and number: .30x83m x 4 ?Electrical stimulation performed: No ?Parameters: N/A ?Treatment response/outcome: Twitch response elicited and Palpable decrease in muscle tension and report of improved tightness ?Post-treatment instructions: Patient instructed to expect possible mild to moderate muscle soreness later today and/or tomorrow. Patient instructed in methods to reduce muscle soreness and to continue prescribed HEP. If patient was dry needled over the lung field, patient was instructed on signs and symptoms of pneumothorax and, however unlikely, to see immediate medical attention should they occur. Patient was also educated on signs and symptoms of infection and to seek medical attention should they occur. Patient verbalized understanding of these instructions and education. ? [facility]                                                [facility]: [facility] ?Therapeutic Exercise: ?Supine cervical retraction 10 x 5 sec ?Seated upper trap and levator scap stretch 2 x 15 sec each ?Trigger Point Dry Needling Treatment: ?Pre-treatment instruction: Patient instructed on dry needling rationale, procedures, and possible side effects including pain during treatment (achy,cramping feeling), bruising, drop of blood, lightheadedness, nausea, sweating. ?Patient Consent Given: Yes ?Education handout provided: No ?Muscles treated: left upper trap, bilateral suboccipitals  ?Needle size and number: .30x536mx 4 ?Electrical stimulation performed: No ?Parameters: N/A ?Treatment response/outcome: Twitch response elicited and Palpable decrease in muscle tension ?Post-treatment instructions: Patient instructed to expect possible mild to moderate muscle soreness later today and/or tomorrow. Patient instructed in methods to reduce muscle soreness and to continue prescribed HEP. If patient was dry needled over the lung field, patient was instructed on signs and symptoms of pneumothorax and,  however unlikely, to see immediate medical attention should they occur. Patient was also educated on signs and symptoms of infection and to seek medical attention should they occur. Patient verbalized understanding of these instructions and education. ?  ?PATIENT EDUCATION:  ?Education details: HEP update ?Person educated: Patient ?Education method: Explanation, Demonstration, Tactile cues, Verbal cues, Handout ?Education comprehension: verbalized understanding, returned demonstration, verbal cues required, tactile cues required, and needs further education ?  ?HOME EXERCISE PROGRAM: ?Access Code: [email or web address] ?  ?  ?ASSESSMENT: ?CLINICAL IMPRESSION: ?Patient tolerated therapy well with no adverse effects. Therapy focused on cervical and thoracic mobility and progression of postural control. Patient reports improvement in neck tightness and demonstrates improved cervical motion  this visit. Incorporate SMFR using tennis ball for cervical paraspinals and suboccipitals with good tolerance. She does require cueing with banded exercises to avoid shoulder shrug and upper trap dominance. Updated HEP with good tolerance. Patient would benefit from continued skilled PT to progress postural strengthening and endurance, and stretching to reduce muscles tightness and improve cervical motion. ?  ?  ?OBJECTIVE IMPAIRMENTS decreased ROM, decreased strength, postural dysfunction, and pain.  ?  ?ACTIVITY LIMITATIONS driving, occupation, shopping, and school.  ?  ?PERSONAL FACTORS Fitness, Past/current experiences, and Time since onset of injury/illness/exacerbation are also affecting patient's functional outcome.  ?  ?  ?GOALS: ?Goals reviewed with patient? Yes ?  ?SHORT TERM GOALS: Target date: 03/13/2022 ?  ?Patient will be I with initial HEP in order to progress with therapy. ?Baseline: HEP provided at evaluation ?03/20/2022: patient independent with initial HEP ?Goal status: MET ?  ?2.  Patient will report neck pain and tightness </=  4/10 in order to reduce functional limitations and improve sleeping ability ?Baseline: pain up to 8/10 (4-5/10 at rest) ?03/25/2022: 3-4 with activity ?Goal status: MET ?  ?3.  Patient will exhibit >/= 10

## 2022-03-20 NOTE — Patient Instructions (Addendum)
Access Code: [email or web address] ?URL: [email or web address] ?Date: 03/20/2022 ?Prepared by: Rosana Hoes ? ?Exercises ?- Seated Cervical Sidebending Stretch  - 2 x daily - 3 reps - 15-20 seconds hold ?- Seated Levator Scapulae Stretch  - 2 x daily - 3 reps - 15-20 seconds hold ?- Supine Cervical Retraction with Towel  - 2 x daily - 10 reps - 5 seconds hold ?- Supine Suboccipital Release with Tennis Balls  - 2 x daily - 10 reps - 5 hold ?- Sidelying Thoracic Lumbar Rotation  - 2 x daily - 10 reps - 5 seconds hold ?- Standing Bilateral Low Shoulder Row with Anchored Resistance  - 1 x daily - 2 sets - 10 reps ?- Shoulder External Rotation and Scapular Retraction with Resistance  - 1 x daily - 2 sets - 10 reps ?

## 2022-03-24 ENCOUNTER — Encounter: Payer: Self-pay | Admitting: Nurse Practitioner

## 2022-03-24 ENCOUNTER — Ambulatory Visit (INDEPENDENT_AMBULATORY_CARE_PROVIDER_SITE_OTHER): Payer: Medicaid Other | Admitting: Nurse Practitioner

## 2022-03-24 VITALS — BP 139/64 | HR 76 | Temp 98.2°F | Ht 67.0 in | Wt 255.0 lb

## 2022-03-24 DIAGNOSIS — Z0000 Encounter for general adult medical examination without abnormal findings: Secondary | ICD-10-CM | POA: Diagnosis not present

## 2022-03-24 DIAGNOSIS — Z309 Encounter for contraceptive management, unspecified: Secondary | ICD-10-CM | POA: Diagnosis not present

## 2022-03-24 MED ORDER — SPRINTEC 28 0.25-35 MG-MCG PO TABS: 1.0000 | ORAL_TABLET | Freq: Every day | ORAL | 11 refills | Status: AC

## 2022-03-24 NOTE — Progress Notes (Signed)
@Patient  ID: , female    DOB: October 30, 2001, 21 y.o.   MRN: [id number] ? ?Chief Complaint  ?Patient presents with  ? Follow-up  ?  Patient is here today for her 4 week follow up visit with no concerns or issues to discuss.  ? ? ?Referring provider: ?Cox, Tiffany B, PA-C ? ? ?HPI ? ?Patient presents today for follow-up/physical.  Patient states that overall she has been doing well.  She will need blood work today.  We discussed healthy diet and exercise.  Patient states that she has been having difficulty with her birth control.  She states that the pharmacy keeps switching her birth control each month to different name brands.  She thinks this may be causing side effects and breakthrough bleeding. We discussed that I can send this back through for her as name brand only and hopefilly this will help. Denies f/c/s, n/v/d, hemoptysis, PND, chest pain or edema. ? ? ? ? ?No Known Allergies ? ? ?There is no immunization history on file for this patient. ? ?Past Medical History:  ?Diagnosis Date  ? Asthma   ? ? ?Tobacco History: ?Social History  ? ?Tobacco Use  ?Smoking Status Never  ?Smokeless Tobacco Never  ? ?Counseling given: Not Answered ? ? ?Outpatient Encounter Medications as of 03/24/2022  ?Medication Sig  ? albuterol (VENTOLIN HFA) 108 (90 Base) MCG/ACT inhaler Inhale 2 puffs into the lungs every 6 (six) hours as needed for wheezing or shortness of breath.  ? budesonide-formoterol (SYMBICORT) 80-4.5 MCG/ACT inhaler Inhale 2 puffs into the lungs 2 (two) times daily.  ? montelukast (SINGULAIR) 10 MG tablet Take 1 tablet (10 mg total) by mouth at bedtime.  ? rizatriptan (MAXALT) 10 MG tablet Take 1 tablet (10 mg total) by mouth as needed for migraine. May repeat in 2 hours if needed  ? SPRINTEC 28 0.25-35 MG-MCG tablet Take 1 tablet by mouth daily.  ? topiramate (TOPAMAX) 25 MG tablet Take 50 mg (2 pills) at bedtime for one week, then take 75 mg (3 pills) at bedtime for one week, then take 100 mg (4  pills) at bedtime  ? [DISCONTINUED] norgestimate-ethinyl estradiol (ORTHO-CYCLEN) 0.25-35 MG-MCG tablet Take 1 tablet by mouth daily.  ? dicyclomine (BENTYL) 20 MG tablet Take 1 tablet (20 mg total) by mouth 3 (three) times daily as needed for spasms. (Patient not taking: Reported on 02/23/2022)  ? ondansetron (ZOFRAN-ODT) 4 MG disintegrating tablet Take 1 tablet (4 mg total) by mouth every 8 (eight) hours as needed for nausea or vomiting. (Patient not taking: Reported on 02/23/2022)  ? pantoprazole (PROTONIX) 40 MG tablet Take 1 tablet (40 mg total) by mouth daily.  ? [DISCONTINUED] senna-docusate (SENOKOT-S) 8.6-50 MG tablet Take 1 tablet by mouth at bedtime as needed for mild constipation or moderate constipation. (Patient not taking: Reported on 02/23/2022)  ? ?No facility-administered encounter medications on file as of 03/24/2022.  ? ? ? ?Review of Systems ? ?Review of Systems  ?Constitutional: Negative.   ?HENT: Negative.    ?Cardiovascular: Negative.   ?Gastrointestinal: Negative.   ?Genitourinary:  Positive for menstrual problem.  ?Allergic/Immunologic: Negative.   ?Neurological: Negative.   ?Psychiatric/Behavioral: Negative.     ? ? ? ?Physical Exam ? ?BP 139/64   Pulse 76   Temp 98.2 ?F (36.8 ?C)   Ht 5\' 7"  (1.702 m)   Wt 255 lb (115.7 kg)   SpO2 100%   BMI 39.94 kg/m?  ? ?Wt Readings from Last 5 Encounters:  ?03/24/22 255  lb (115.7 kg)  ?02/23/22 253 lb 0.2 oz (114.8 kg)  ?01/25/22 253 lb (114.8 kg)  ?10/25/21 254 lb 6.4 oz (115.4 kg)  ?10/19/21 255 lb (115.7 kg)  ? ? ? ?Physical Exam ? ? ?Lab Results: ? ?CBC ?   ?Component Value Date/Time  ? WBC 6.8 03/24/2022 1541  ? WBC 6.9 10/19/2021 0226  ? RBC 4.63 03/24/2022 1541  ? RBC 4.48 10/19/2021 0226  ? HGB 13.3 03/24/2022 1541  ? HCT 39.4 03/24/2022 1541  ? PLT 278 03/24/2022 1541  ? MCV 85 03/24/2022 1541  ? MCH 28.7 03/24/2022 1541  ? MCH 28.6 10/19/2021 0226  ? MCHC 33.8 03/24/2022 1541  ? MCHC 32.6 10/19/2021 0226  ? RDW 11.8 03/24/2022 1541   ? ? ?BMET ?   ?Component Value Date/Time  ? NA 141 03/24/2022 1541  ? K 4.9 03/24/2022 1541  ? CL 104 03/24/2022 1541  ? CO2 26 03/24/2022 1541  ? GLUCOSE 94 03/24/2022 1541  ? GLUCOSE 133 (H) 10/19/2021 0226  ? BUN 11 03/24/2022 1541  ? CREATININE 0.84 03/24/2022 1541  ? CALCIUM 9.3 03/24/2022 1541  ? GFRNONAA >60 10/19/2021 0226  ? ? ?BNP ?No results found for: BNP ? ?ProBNP ?No results found for: PROBNP ? ?Imaging: ?No results found. ? ? ?Assessment & Plan:  ? ?Routine health maintenance ?- CBC ?- Comprehensive metabolic panel ?- TSH ?- Lipid Panel ? ? ?2. Encounter for contraceptive management, unspecified type ? ?- SPRINTEC 28 0.25-35 MG-MCG tablet; Take 1 tablet by mouth daily.  Dispense: 28 tablet; Refill: 11 ? ? ? ?Follow up: ? ?Follow up in 6 months or sooner ? ? ? ? ?Ivonne Andrew, NP ?03/31/2022 ? ?

## 2022-03-24 NOTE — Patient Instructions (Addendum)
1. Routine health maintenance ? ?- CBC ?- Comprehensive metabolic panel ?- TSH ?- Lipid Panel ? ? ?2. Encounter for contraceptive management, unspecified type ? ?- SPRINTEC 28 0.25-35 MG-MCG tablet; Take 1 tablet by mouth daily.  Dispense: 28 tablet; Refill: 11 ? ? ? ?Follow up: ? ?Follow up in 6 months or sooner ? ? ? ? ? ?Health Maintenance, Female ?Adopting a healthy lifestyle and getting preventive care are important in promoting health and wellness. Ask your health care provider about: ?The right schedule for you to have regular tests and exams. ?Things you can do on your own to prevent diseases and keep yourself healthy. ?What should I know about diet, weight, and exercise? ?Eat a healthy diet ? ?Eat a diet that includes plenty of vegetables, fruits, low-fat dairy products, and lean protein. ?Do not eat a lot of foods that are high in solid fats, added sugars, or sodium. ?Maintain a healthy weight ?Body mass index (BMI) is used to identify weight problems. It estimates body fat based on height and weight. Your health care provider can help determine your BMI and help you achieve or maintain a healthy weight. ?Get regular exercise ?Get regular exercise. This is one of the most important things you can do for your health. Most adults should: ?Exercise for at least 150 minutes each week. The exercise should increase your heart rate and make you sweat (moderate-intensity exercise). ?Do strengthening exercises at least twice a week. This is in addition to the moderate-intensity exercise. ?Spend less time sitting. Even light physical activity can be beneficial. ?Watch cholesterol and blood lipids ?Have your blood tested for lipids and cholesterol at 22 years of age, then have this test every 5 years. ?Have your cholesterol levels checked more often if: ?Your lipid or cholesterol levels are high. ?You are older than 22 years of age. ?You are at high risk for heart disease. ?What should I know about cancer  screening? ?Depending on your health history and family history, you may need to have cancer screening at various ages. This may include screening for: ?Breast cancer. ?Cervical cancer. ?Colorectal cancer. ?Skin cancer. ?Lung cancer. ?What should I know about heart disease, diabetes, and high blood pressure? ?Blood pressure and heart disease ?High blood pressure causes heart disease and increases the risk of stroke. This is more likely to develop in people who have high blood pressure readings or are overweight. ?Have your blood pressure checked: ?Every 3-5 years if you are 40-18 years of age. ?Every year if you are 36 years old or older. ?Diabetes ?Have regular diabetes screenings. This checks your fasting blood sugar level. Have the screening done: ?Once every three years after age 40 if you are at a normal weight and have a low risk for diabetes. ?More often and at a younger age if you are overweight or have a high risk for diabetes. ?What should I know about preventing infection? ?Hepatitis B ?If you have a higher risk for hepatitis B, you should be screened for this virus. Talk with your health care provider to find out if you are at risk for hepatitis B infection. ?Hepatitis C ?Testing is recommended for: ?Everyone born from [birth year] through 1966. ?Anyone with known risk factors for hepatitis C. ?Sexually transmitted infections (STIs) ?Get screened for STIs, including gonorrhea and chlamydia, if: ?You are sexually active and are younger than 22 years of age. ?You are older than 22 years of age and your health care provider tells you that you are at risk  for this type of infection. ?Your sexual activity has changed since you were last screened, and you are at increased risk for chlamydia or gonorrhea. Ask your health care provider if you are at risk. ?Ask your health care provider about whether you are at high risk for HIV. Your health care provider may recommend a prescription medicine to help prevent HIV  infection. If you choose to take medicine to prevent HIV, you should first get tested for HIV. You should then be tested every 3 months for as long as you are taking the medicine. ?Pregnancy ?If you are about to stop having your period (premenopausal) and you may become pregnant, seek counseling before you get pregnant. ?Take 400 to 800 micrograms (mcg) of folic acid every day if you become pregnant. ?Ask for birth control (contraception) if you want to prevent pregnancy. ?Osteoporosis and menopause ?Osteoporosis is a disease in which the bones lose minerals and strength with aging. This can result in bone fractures. If you are 62 years old or older, or if you are at risk for osteoporosis and fractures, ask your health care provider if you should: ?Be screened for bone loss. ?Take a calcium or vitamin D supplement to lower your risk of fractures. ?Be given hormone replacement therapy (HRT) to treat symptoms of menopause. ?Follow these instructions at home: ?Alcohol use ?Do not drink alcohol if: ?Your health care provider tells you not to drink. ?You are pregnant, may be pregnant, or are planning to become pregnant. ?If you drink alcohol: ?Limit how much you have to: ?0-1 drink a day. ?Know how much alcohol is in your drink. In the [place] one drink equals one 12 oz bottle of beer (355 mL), one 5 oz glass of wine (148 mL), or one 1? oz glass of hard liquor (44 mL). ?Lifestyle ?Do not use any products that contain nicotine or tobacco. These products include cigarettes, chewing tobacco, and vaping devices, such as e-cigarettes. If you need help quitting, ask your health care provider. ?Do not use street drugs. ?Do not share needles. ?Ask your health care provider for help if you need support or information about quitting drugs. ?General instructions ?Schedule regular health, dental, and eye exams. ?Stay current with your vaccines. ?Tell your health care provider if: ?You often feel depressed. ?You have ever been abused  or do not feel safe at home. ?Summary ?Adopting a healthy lifestyle and getting preventive care are important in promoting health and wellness. ?Follow your health care provider's instructions about healthy diet, exercising, and getting tested or screened for diseases. ?Follow your health care provider's instructions on monitoring your cholesterol and blood pressure. ?This information is not intended to replace advice given to you by your health care provider. Make sure you discuss any questions you have with your health care provider. ?Document Revised: 04/04/2021 Document Reviewed: 04/04/2021 ?Elsevier Patient Education ? [place]. ? ? ?

## 2022-03-25 LAB — COMPREHENSIVE METABOLIC PANEL
ALT: 10 IU/L (ref 0–32)
AST: 14 IU/L (ref 0–40)
Albumin/Globulin Ratio: 1.4 (ref 1.2–2.2)
Albumin: 4 g/dL (ref 3.9–5.0)
Alkaline Phosphatase: 101 IU/L (ref 44–121)
BUN/Creatinine Ratio: 13 (ref 9–23)
BUN: 11 mg/dL (ref 6–20)
Bilirubin Total: 0.2 mg/dL (ref 0.0–1.2)
CO2: 26 mmol/L (ref 20–29)
Calcium: 9.3 mg/dL (ref 8.7–10.2)
Chloride: 104 mmol/L (ref 96–106)
Creatinine, Ser: 0.84 mg/dL (ref 0.57–1.00)
Globulin, Total: 2.9 g/dL (ref 1.5–4.5)
Glucose: 94 mg/dL (ref 70–99)
Potassium: 4.9 mmol/L (ref 3.5–5.2)
Sodium: 141 mmol/L (ref 134–144)
Total Protein: 6.9 g/dL (ref 6.0–8.5)
eGFR: 101 mL/min/{1.73_m2} (ref >59)

## 2022-03-25 LAB — CBC
Hematocrit: 39.4 % (ref 34.0–46.6)
Hemoglobin: 13.3 g/dL (ref 11.1–15.9)
MCH: 28.7 pg (ref 26.6–33.0)
MCHC: 33.8 g/dL (ref 31.5–35.7)
MCV: 85 fL (ref 79–97)
Platelets: 278 10*3/uL (ref 150–450)
RBC: 4.63 x10E6/uL (ref 3.77–5.28)
RDW: 11.8 % (ref 11.7–15.4)
WBC: 6.8 10*3/uL (ref 3.4–10.8)

## 2022-03-25 LAB — LIPID PANEL
Chol/HDL Ratio: 2.6 ratio (ref 0.0–4.4)
Cholesterol, Total: 152 mg/dL (ref 100–199)
HDL: 58 mg/dL (ref >39)
LDL Chol Calc (NIH): 79 mg/dL (ref 0–99)
Triglycerides: 80 mg/dL (ref 0–149)
VLDL Cholesterol Cal: 15 mg/dL (ref 5–40)

## 2022-03-25 LAB — TSH: TSH: 1.93 u[IU]/mL (ref 0.450–4.500)

## 2022-03-28 ENCOUNTER — Ambulatory Visit: Payer: Medicaid Other

## 2022-03-31 ENCOUNTER — Encounter: Payer: Self-pay | Admitting: Nurse Practitioner

## 2022-03-31 DIAGNOSIS — Z0000 Encounter for general adult medical examination without abnormal findings: Secondary | ICD-10-CM | POA: Insufficient documentation

## 2022-03-31 NOTE — Assessment & Plan Note (Signed)
-   CBC ?- Comprehensive metabolic panel ?- TSH ?- Lipid Panel ? ? ?2. Encounter for contraceptive management, unspecified type ? ?- SPRINTEC 28 0.25-35 MG-MCG tablet; Take 1 tablet by mouth daily.  Dispense: 28 tablet; Refill: 11 ? ? ? ?Follow up: ? ?Follow up in 6 months or sooner ?

## 2022-04-04 NOTE — Therapy (Signed)
?OUTPATIENT PHYSICAL THERAPY TREATMENT NOTE ? ?DISCHARGE ? ? ?Patient Name: Vanessa Kelley ?MRN: [id number] ?DOB:2001-06-23, 21 y.o., female ?Today's Date: 04/05/2022 ? ?PCP: Fenton Foy, NP ?REFERRING PROVIDER: Cox, Gaetano Hawthorne, PA-C ? ? PT End of Session - 04/05/22 1501   ? ? Visit Number 4   ? Number of Visits 8   ? Date for PT Re-Evaluation 04/10/22   ? Authorization Type [facility] [employer]   ? Authorization Time Period 02/14/2022 - 04/15/2022   ? Authorization - Visit Number 4   ? Authorization - Number of Visits 10   ? PT Start Time 6073   ? PT Stop Time 1525   ? PT Time Calculation (min) 39 min   ? Activity Tolerance Patient tolerated treatment well   ? Behavior During Therapy [facility] for tasks assessed/performed   ? ?  ?  ? ?  ? ? ? ? ?Past Medical History:  ?Diagnosis Date  ? Asthma   ? ?History reviewed. No pertinent surgical history. ?Patient Active Problem List  ? Diagnosis Date Noted  ? Routine health maintenance 03/31/2022  ? Mild intermittent asthma with exacerbation 02/23/2022  ? Intermittent chest pain 02/23/2022  ? Migraine 01/25/2022  ? Asthma 10/25/2021  ? ? ?REFERRING PROVIDER: Genia Harold, MD ?  ?REFERRING DIAG: Cervicalgia ? ?THERAPY DIAG:  ?Cervicalgia ? ?Muscle weakness (generalized) ? ?Abnormal posture ? ?PERTINENT HISTORY: None ? ?PRECAUTIONS: None ? ?SUBJECTIVE: Patient reports she is doing well. She has finished with school and denies any resent headaches. She reports she is currently tired from working 3rd shift. ? ?PAIN:  ?Are you having pain?  ?NPRS scale: 0 /10 ?Pain location: Neck ?Pain description: Tight ?Aggravating factors: Laying down, driving, trying to sit up straight, turning her neck, lifting something heavy ?Relieving factors: Body pillow and heating pad ? ?PATIENT GOALS: Get rid of headaches and neck tightness ? ? ?OBJECTIVE:  ?PATIENT SURVEYS:  ?NDI 10/50 ?  ?POSTURE:  ?Rounded shoulder and forward head posture ?  ? PALPATION: ?Tender to palpation bilateral upper trap  region and cervical paraspinals ?  ? CERVICAL ROM:  ?  ?Active ROM A/PROM (deg) ?02/13/2022  ? ?03/20/2022  ? ?04/05/2022  ?Flexion 60    ?Extension 40    ?Right lateral flexion 25 30 45  ?Left lateral flexion 25 30 45  ?Right rotation 70  70  ?Left rotation 55 60 70  ?Patient reports left>right neck tightness with active motion ?   ?UE MMT: ?  ?MMT Right ?02/13/2022 Left ?02/13/2022  ?04/05/2022  ?Shoulder flexion 5 5   ?Shoulder extension 5 5   ?Shoulder abduction 5 5   ?Shoulder internal rotation 5 5   ?Shoulder external rotation 5 5   ?Periscapular muscles 4- 4  ?  ?CERVICAL SPECIAL TESTS:  ?Neck flexor muscle endurance test: 7 seconds ? 04/05/2022: 23 seconds ?  ?  ?TODAY'S TREATMENT:  ?[facility] Adult PT Treatment:                                                DATE: 04/05/2022 ?Therapeutic Exercise: ?UBE L4 x 4 min (2 fwd/bwd) while taking subjective ?Supine cervical retraction 2 x 10 with 5 sec hold ?Supine horizontal abduction with blue 2 x 15 ?Sidelying thoracic rotation 10 x 5 sec each ?Doorway pec stretch at 90 deg 3 x 20 sec ?Double ER and scap  retraction with blue 2 x 15 ?Row with blue 2 x 15 ?Extension and scap retraction with green 2 x 15 ?Manual: ?Suboccipital release with gentle manual traction x 5 bouts ?Passive upper trap and levator scap stretching ? ? ?[facility] Adult PT Treatment:                                                DATE: 03/20/2022 ?Therapeutic Exercise: ?UBE L4 x 4 min (2 fwd/bwd) while taking subjective ?Supine cervical retraction 10 x 5 sec, with peanut ball places under neck 10 x 5 sec ?Sidelying thoracic rotation 10 x 5 sec each ?Row with blue 2 x 15 ?Double ER and scap retraction with blue 2 x 10 ?Manual: ?Suboccipital release with gentle manual traction x 5 bouts ?Passive upper trap and levator scap stretching ?  ?PATIENT EDUCATION:  ?Education details: POC discharge, progress toward goals, HEP ?Person educated: Patient ?Education method: Explanation, Demonstration, Tactile cues, Verbal  cues ?Education comprehension: verbalized understanding, returned demonstration, verbal cues required, tactile cues required, and needs further education ?  ?HOME EXERCISE PROGRAM: ?Access Code: [email or web address] ?  ?  ?ASSESSMENT: ?CLINICAL IMPRESSION: ?Patient tolerated therapy well with no adverse effects. She has achieved all established goals and reports no recent headaches since finishing up with school. She demonstrates improve range of motion, strength and postural control. She reports improvement in her functional ability and is independent with her HEP. Patient will be formally discharged from PT. ?  ?  ?OBJECTIVE IMPAIRMENTS decreased ROM, decreased strength, postural dysfunction, and pain.  ?  ?ACTIVITY LIMITATIONS driving, occupation, shopping, and school.  ?  ?PERSONAL FACTORS Fitness, Past/current experiences, and Time since onset of injury/illness/exacerbation are also affecting patient's functional outcome.  ?  ?  ?GOALS: ?Goals reviewed with patient? Yes ?  ?SHORT TERM GOALS: Target date: 03/13/2022 ?  ?Patient will be I with initial HEP in order to progress with therapy. ?Baseline: HEP provided at evaluation ?03/20/2022: patient independent with initial HEP ?Goal status: MET ?  ?2.  Patient will report neck pain and tightness </= 4/10 in order to reduce functional limitations and improve sleeping ability ?Baseline: pain up to 8/10 (4-5/10 at rest) ?03/25/2022: 3-4 with activity ?Goal status: MET ?  ?3.  Patient will exhibit >/= 10 deg improvement in left cervical rotation to improve driving ability ?Baseline: 55 deg left cervical rotation ?03/20/2022: 60 deg ?04/05/2022: 70 deg ?Goal status: MET ?  ?LONG TERM GOALS: Target date: 04/10/2022 ?  ?Patient will be I with final HEP to maintain progress from PT. ?Baseline: HEP provided at evaluation ?04/05/2022: independent with HEP ?Goal status: MET ?  ?2.  Patient will report NDI </= 3/50 in order to indicate improved functional ability ?Baseline: 10/50 ?04/05/2022:  3/10 ?Goal status: MET ?  ?3.  Patient will demonstrate periscapular strength >/= 4/5 MMT and DNF endurance >/= 20 sec in order to improve postural control and reduce pain with sitting at school  ?Baseline: periscapular strength 4-/5 MMT, DNF endurance 7 seconds ?04/05/2022: periscapular strength 4/5 MMT, DNF endurance 23 seconds ?Goal status: MET ?  ?4.  Patient will report neck pain and tightness </= 2/10 in order to allow for return to all activities without functional limitation ?Baseline: 8/10 at worst, 4-5/10 at rest ?04/05/2022: patient denies pain currently ?Goal status: MET ?  ?  ?PLAN: ?PT FREQUENCY: 1-2x/week ?  ?PT  DURATION: 8 weeks ?  ?PLANNED INTERVENTIONS: Therapeutic exercises, Therapeutic activity, Neuromuscular re-education, Balance training, Gait training, Patient/Family education, Joint manipulation, Joint mobilization, Aquatic Therapy, Dry Needling, Electrical stimulation, Spinal manipulation, Spinal mobilization, Cryotherapy, Moist heat, Taping, and Manual therapy ?  ?PLAN FOR NEXT SESSION: NA - discharge ? ? ? ?Hilda Blades, PT, DPT, LAT, ATC ?04/05/22  3:28 PM ?Phone: [phone] ?Fax: [phone] ? ? ?PHYSICAL THERAPY DISCHARGE SUMMARY ? ?Visits from Start of Care: 4 ? ?Current functional level related to goals / functional outcomes: ?See above ?  ?Remaining deficits: ?See above ?  ?Education / Equipment: ?HEP  ? ?Patient agrees to discharge. Patient goals were met. Patient is being discharged due to meeting the stated rehab goals. ? ?

## 2022-04-05 ENCOUNTER — Encounter: Payer: Self-pay | Admitting: Physical Therapy

## 2022-04-05 ENCOUNTER — Other Ambulatory Visit: Payer: Self-pay

## 2022-04-05 ENCOUNTER — Ambulatory Visit: Payer: Medicaid Other | Attending: Psychiatry | Admitting: Physical Therapy

## 2022-04-05 DIAGNOSIS — M542 Cervicalgia: Secondary | ICD-10-CM | POA: Insufficient documentation

## 2022-04-05 DIAGNOSIS — R293 Abnormal posture: Secondary | ICD-10-CM | POA: Diagnosis present

## 2022-04-05 DIAGNOSIS — M6281 Muscle weakness (generalized): Secondary | ICD-10-CM | POA: Insufficient documentation

## 2022-04-05 NOTE — Patient Instructions (Signed)
Access Code: [email or web address] ?URL: [email or web address] ?Date: 04/05/2022 ?Prepared by: Hilda Blades ? ?Exercises ?- Seated Cervical Sidebending Stretch  - 2 x daily - 3 reps - 15-20 seconds hold ?- Seated Levator Scapulae Stretch  - 2 x daily - 3 reps - 15-20 seconds hold ?- Supine Cervical Retraction with Towel  - 2 x daily - 10 reps - 5 seconds hold ?- Supine Suboccipital Release with Tennis Balls  - 2 x daily - 10 reps - 5 hold ?- Supine Shoulder Horizontal Abduction with Resistance  - 1 x daily - 2 sets - 15 reps ?- Sidelying Thoracic Lumbar Rotation  - 2 x daily - 10 reps - 5 seconds hold ?- Standing Bilateral Low Shoulder Row with Anchored Resistance  - 1 x daily - 2 sets - 15 reps ?- Shoulder External Rotation and Scapular Retraction with Resistance  - 1 x daily - 2 sets - 15 reps ?- Scapular Retraction with Resistance Advanced  - 1 x daily - 2 sets - 15 reps ?

## 2022-04-21 ENCOUNTER — Encounter: Payer: Self-pay | Admitting: Nurse Practitioner

## 2022-04-21 ENCOUNTER — Ambulatory Visit (INDEPENDENT_AMBULATORY_CARE_PROVIDER_SITE_OTHER): Payer: Medicaid Other | Admitting: Nurse Practitioner

## 2022-04-21 VITALS — BP 126/80 | HR 73 | Temp 97.9°F | Ht 67.0 in | Wt 258.0 lb

## 2022-04-21 DIAGNOSIS — Z0000 Encounter for general adult medical examination without abnormal findings: Secondary | ICD-10-CM

## 2022-04-21 LAB — POCT URINALYSIS DIP (CLINITEK)
Bilirubin, UA: NEGATIVE
Glucose, UA: NEGATIVE mg/dL
Ketones, POC UA: NEGATIVE mg/dL
Nitrite, UA: NEGATIVE
POC PROTEIN,UA: NEGATIVE
Spec Grav, UA: 1.01 (ref 1.010–1.025)
Urobilinogen, UA: 0.2 E.U./dL
pH, UA: 6.5 (ref 5.0–8.0)

## 2022-04-21 NOTE — Progress Notes (Addendum)
@Patient  ID: , female    DOB: [birth year], 21 y.o.   MRN: [id number]  Chief Complaint  Patient presents with   Annual Exam    Pt is here for physical and pap smear    Referring provider: [id number], NP   HPI  Patient presents today for PAP smear. Patient is also requesting swab for STD. Patient denies any current issues. No concerns today. Denies f/c/s, n/v/d, hemoptysis, PND, chest pain or edema.    No Known Allergies   There is no immunization history on file for this patient.  Past Medical History:  Diagnosis Date   Asthma     Tobacco History: Social History   Tobacco Use  Smoking Status Never  Smokeless Tobacco Never   Counseling given: Not Answered   Outpatient Encounter Medications as of 04/21/2022  Medication Sig   albuterol (VENTOLIN HFA) 108 (90 Base) MCG/ACT inhaler Inhale 2 puffs into the lungs every 6 (six) hours as needed for wheezing or shortness of breath.   budesonide-formoterol (SYMBICORT) 80-4.5 MCG/ACT inhaler Inhale 2 puffs into the lungs 2 (two) times daily.   montelukast (SINGULAIR) 10 MG tablet Take 1 tablet (10 mg total) by mouth at bedtime.   rizatriptan (MAXALT) 10 MG tablet Take 1 tablet (10 mg total) by mouth as needed for migraine. May repeat in 2 hours if needed   topiramate (TOPAMAX) 25 MG tablet Take 50 mg (2 pills) at bedtime for one week, then take 75 mg (3 pills) at bedtime for one week, then take 100 mg (4 pills) at bedtime   ondansetron (ZOFRAN-ODT) 4 MG disintegrating tablet Take 1 tablet (4 mg total) by mouth every 8 (eight) hours as needed for nausea or vomiting. (Patient not taking: Reported on 02/23/2022)   SPRINTEC 28 0.25-35 MG-MCG tablet Take 1 tablet by mouth daily.   [DISCONTINUED] dicyclomine (BENTYL) 20 MG tablet Take 1 tablet (20 mg total) by mouth 3 (three) times daily as needed for spasms. (Patient not taking: Reported on 02/23/2022)   [DISCONTINUED] pantoprazole (PROTONIX) 40 MG tablet Take 1  tablet (40 mg total) by mouth daily.   No facility-administered encounter medications on file as of 04/21/2022.     Review of Systems  Review of Systems  Constitutional: Negative.   HENT: Negative.    Cardiovascular: Negative.   Gastrointestinal: Negative.   Allergic/Immunologic: Negative.   Neurological: Negative.   Psychiatric/Behavioral: Negative.        Physical Exam  BP 126/80 (BP Location: Left Arm, Patient Position: Sitting, Cuff Size: Large)   Pulse 73   Temp 97.9 F (36.6 C)   Ht 5\' 7"  (1.702 m)   Wt 258 lb (117 kg)   SpO2 100%   BMI 40.41 kg/m   Wt Readings from Last 5 Encounters:  04/21/22 258 lb (117 kg)  03/24/22 255 lb (115.7 kg)  02/23/22 253 lb 0.2 oz (114.8 kg)  01/25/22 253 lb (114.8 kg)  10/25/21 254 lb 6.4 oz (115.4 kg)     Physical Exam Vitals and nursing note reviewed. Exam conducted with a chaperone present.  Constitutional:      General: She is not in acute distress.    Appearance: She is well-developed.  Cardiovascular:     Rate and Rhythm: Normal rate and regular rhythm.  Pulmonary:     Effort: Pulmonary effort is normal.     Breath sounds: Normal breath sounds.  Genitourinary:    General: Normal vulva.     Cervix: Discharge, friability and  cervical bleeding (scant amount) present.  Neurological:     Mental Status: She is alert and oriented to person, place, and time.     Lab Results:  CBC    Component Value Date/Time   WBC 6.8 03/24/2022 1541   WBC 6.9 10/19/2021 0226   RBC 4.63 03/24/2022 1541   RBC 4.48 10/19/2021 0226   HGB 13.3 03/24/2022 1541   HCT 39.4 03/24/2022 1541   PLT 278 03/24/2022 1541   MCV 85 03/24/2022 1541   MCH 28.7 03/24/2022 1541   MCH 28.6 10/19/2021 0226   MCHC 33.8 03/24/2022 1541   MCHC 32.6 10/19/2021 0226   RDW 11.8 03/24/2022 1541    BMET    Component Value Date/Time   NA 141 03/24/2022 1541   K 4.9 03/24/2022 1541   CL 104 03/24/2022 1541   CO2 26 03/24/2022 1541   GLUCOSE 94  03/24/2022 1541   GLUCOSE 133 (H) 10/19/2021 0226   BUN 11 03/24/2022 1541   CREATININE 0.84 03/24/2022 1541   CALCIUM 9.3 03/24/2022 1541   GFRNONAA >60 10/19/2021 0226    BNP No results found for: BNP  ProBNP No results found for: PROBNP  Imaging: No results found.   Assessment & Plan:   Routine health maintenance - NuSwab Vaginitis Plus (VG+) - Pap IG, Ct-Ng TV HSV 1/2 NAA - POCT URINALYSIS DIP (CLINITEK) - Urine Culture  Follow up:  Follow up in 6 months or sooner if needed   Patient Instructions  1. Routine health maintenance  - NuSwab Vaginitis Plus (VG+) - Pap IG, Ct-Ng TV HSV 1/2 NAA - POCT URINALYSIS DIP (CLINITEK) - Urine Culture  Follow up:  Follow up in 6 months or sooner if needed    Pap Test Why am I having this test? A Pap test, also called a Pap smear, is a screening test to check for signs of: Infection. Cancer of the cervix. The cervix is the lower part of the uterus that opens into the vagina. Changes that may be a sign that cancer is developing (precancerous changes). Women need this test on a regular basis. In general, you should have a Pap test every 3 years until you reach menopause or age 22. Women aged 30-60 may choose to have their Pap test done at the same time as an HPV (human papillomavirus) test every 5 years (instead of every 3 years). Your health care provider may recommend having Pap tests more or less often depending on your medical conditions and past Pap test results. What is being tested? Cervical cells are tested for signs of infection or abnormalities. What kind of sample is taken?  Your health care provider will collect a sample of cells from the surface of your cervix. This will be done using a small cotton swab, plastic spatula, or brush that is inserted into your vagina using a tool called a speculum. This sample is often collected during a pelvic exam, when you are lying on your back on an exam table with your feet  in footrests (stirrups). In some cases, fluids (secretions) from the cervix or vagina may also be collected. How do I prepare for this test? Be aware of where you are in your menstrual cycle. If you are menstruating on the day of the test, you may be asked to reschedule. You may need to reschedule if you have a known vaginal infection on the day of the test. Follow instructions from your health care provider about: Changing or stopping your regular  medicines. Some medicines can cause abnormal test results, such as vaginal medicines and tetracycline. Avoiding douching 2-3 days before or the day of the test. Tell a health care provider about: Any allergies you have. All medicines you are taking, including vitamins, herbs, eye drops, creams, and over-the-counter medicines. Any bleeding problems you have. Any surgeries you have had. Any medical conditions you have. Whether you are pregnant or may be pregnant. How are the results reported? Your test results will be reported as either abnormal or normal. What do the results mean? A normal test result means that you do not have signs of cancer of the cervix. An abnormal result may mean that you have: Cancer. A Pap test by itself is not enough to diagnose cancer. You will have more tests done if cancer is suspected. Precancerous changes in your cervix. Inflammation of the cervix. An STI (sexually transmitted infection). A fungal infection. A parasite infection. Talk with your health care provider about what your results mean. In some cases, your health care provider may do more testing to confirm the results. Questions to ask your health care provider Ask your health care provider, or the department that is doing the test: When will my results be ready? How will I get my results? What are my treatment options? What other tests do I need? What are my next steps? Summary In general, women should have a Pap test every 3 years until they reach  menopause or age 27. Your health care provider will collect a sample of cells from the surface of your cervix. This will be done using a small cotton swab, plastic spatula, or brush. In some cases, fluids (secretions) from the cervix or vagina may also be collected. This information is not intended to replace advice given to you by your health care provider. Make sure you discuss any questions you have with your health care provider. Document Revised: 02/11/2021 Document Reviewed: 02/11/2021 Elsevier Patient Education  [address]    Ivonne Andrew, [place] 04/21/2022

## 2022-04-21 NOTE — Patient Instructions (Addendum)
1. Routine health maintenance  - NuSwab Vaginitis Plus (VG+) - Pap IG, Ct-Ng TV HSV 1/2 NAA - POCT URINALYSIS DIP (CLINITEK) - Urine Culture  Follow up:  Follow up in 6 months or sooner if needed    Pap Test Why am I having this test? A Pap test, also called a Pap smear, is a screening test to check for signs of: Infection. Cancer of the cervix. The cervix is the lower part of the uterus that opens into the vagina. Changes that may be a sign that cancer is developing (precancerous changes). Women need this test on a regular basis. In general, you should have a Pap test every 3 years until you reach menopause or age 7. Women aged 30-60 may choose to have their Pap test done at the same time as an HPV (human papillomavirus) test every 5 years (instead of every 3 years). Your health care provider may recommend having Pap tests more or less often depending on your medical conditions and past Pap test results. What is being tested? Cervical cells are tested for signs of infection or abnormalities. What kind of sample is taken?  Your health care provider will collect a sample of cells from the surface of your cervix. This will be done using a small cotton swab, plastic spatula, or brush that is inserted into your vagina using a tool called a speculum. This sample is often collected during a pelvic exam, when you are lying on your back on an exam table with your feet in footrests (stirrups). In some cases, fluids (secretions) from the cervix or vagina may also be collected. How do I prepare for this test? Be aware of where you are in your menstrual cycle. If you are menstruating on the day of the test, you may be asked to reschedule. You may need to reschedule if you have a known vaginal infection on the day of the test. Follow instructions from your health care provider about: Changing or stopping your regular medicines. Some medicines can cause abnormal test results, such as vaginal  medicines and tetracycline. Avoiding douching 2-3 days before or the day of the test. Tell a health care provider about: Any allergies you have. All medicines you are taking, including vitamins, herbs, eye drops, creams, and over-the-counter medicines. Any bleeding problems you have. Any surgeries you have had. Any medical conditions you have. Whether you are pregnant or may be pregnant. How are the results reported? Your test results will be reported as either abnormal or normal. What do the results mean? A normal test result means that you do not have signs of cancer of the cervix. An abnormal result may mean that you have: Cancer. A Pap test by itself is not enough to diagnose cancer. You will have more tests done if cancer is suspected. Precancerous changes in your cervix. Inflammation of the cervix. An STI (sexually transmitted infection). A fungal infection. A parasite infection. Talk with your health care provider about what your results mean. In some cases, your health care provider may do more testing to confirm the results. Questions to ask your health care provider Ask your health care provider, or the department that is doing the test: When will my results be ready? How will I get my results? What are my treatment options? What other tests do I need? What are my next steps? Summary In general, women should have a Pap test every 3 years until they reach menopause or age 55. Your health care provider will  collect a sample of cells from the surface of your cervix. This will be done using a small cotton swab, plastic spatula, or brush. In some cases, fluids (secretions) from the cervix or vagina may also be collected. This information is not intended to replace advice given to you by your health care provider. Make sure you discuss any questions you have with your health care provider. Document Revised: 02/11/2021 Document Reviewed: 02/11/2021 Elsevier Patient Education   2023 [employer].

## 2022-04-21 NOTE — Assessment & Plan Note (Signed)
-   NuSwab Vaginitis Plus (VG+) - Pap IG, Ct-Ng TV HSV 1/2 NAA - POCT URINALYSIS DIP (CLINITEK) - Urine Culture  Follow up:  Follow up in 6 months or sooner if needed

## 2022-04-25 LAB — URINE CULTURE

## 2022-04-26 LAB — NUSWAB VAGINITIS PLUS (VG+)
Atopobium vaginae: HIGH Score — AB
Candida albicans, NAA: NEGATIVE
Candida glabrata, NAA: NEGATIVE
Chlamydia trachomatis, NAA: NEGATIVE
Megasphaera 1: HIGH Score — AB
Neisseria gonorrhoeae, NAA: NEGATIVE
Trich vag by NAA: NEGATIVE

## 2022-04-27 ENCOUNTER — Other Ambulatory Visit: Payer: Self-pay | Admitting: Nurse Practitioner

## 2022-04-27 MED ORDER — AMOXICILLIN-POT CLAVULANATE 875-125 MG PO TABS: 1.0000 | ORAL_TABLET | Freq: Two times a day (BID) | ORAL | 0 refills | Status: AC

## 2022-04-27 MED ORDER — METRONIDAZOLE 0.75 % VA GEL: 1.0000 | Freq: Every day | VAGINAL | 0 refills | Status: DC

## 2022-04-28 ENCOUNTER — Other Ambulatory Visit: Payer: Self-pay

## 2022-04-28 ENCOUNTER — Telehealth: Payer: Self-pay | Admitting: Nurse Practitioner

## 2022-04-28 LAB — PAP IG, CT-NG TV HSV 1/2 NAA
Chlamydia, Nuc. Acid Amp: NEGATIVE
Gonococcus, Nuc. Acid Amp: NEGATIVE
HSV 1 NAA: NEGATIVE
HSV 2 NAA: NEGATIVE
Trich vag by NAA: NEGATIVE

## 2022-04-28 MED ORDER — METRONIDAZOLE 0.75 % VA GEL: 1.0000 | Freq: Every day | VAGINAL | 0 refills | Status: AC

## 2022-04-28 NOTE — Telephone Encounter (Signed)
Rx refill request sent to the provider to resend anther script.

## 2022-04-28 NOTE — Telephone Encounter (Signed)
Pharmacy stating they never received the prescription for the gel

## 2022-05-11 ENCOUNTER — Ambulatory Visit (INDEPENDENT_AMBULATORY_CARE_PROVIDER_SITE_OTHER): Payer: Medicaid Other | Admitting: Psychiatry

## 2022-05-11 ENCOUNTER — Encounter: Payer: Self-pay | Admitting: Psychiatry

## 2022-05-11 VITALS — BP 140/82 | HR 76 | Ht 67.0 in | Wt 257.0 lb

## 2022-05-11 DIAGNOSIS — G43109 Migraine with aura, not intractable, without status migrainosus: Secondary | ICD-10-CM

## 2022-05-11 MED ORDER — ZOLMITRIPTAN 5 MG PO TABS: 5.0000 mg | ORAL_TABLET | ORAL | 0 refills | Status: DC | PRN

## 2022-05-11 NOTE — Patient Instructions (Addendum)
Start Zomig as needed for migraines. Take one pill at onset of migraine. May repeat a dose in 2 hours if headache persists. Max dose 2 pills in 24 hours

## 2022-05-11 NOTE — Progress Notes (Signed)
   CC:  headaches  Follow-up Visit  Last visit: 01/25/22  Brief HPI: 21 year old female with a history of asthma who follows in clinic for migraine with aura.   At her last visit Topamax was uptitrated to 100 mg QHS. Referral to neck PT was placed.  Interval History: Headaches have improved since her last visit. Feels they started to improve after she graduated college. She has had 2 migraines this month. She has stopped taking Topamax as her headaches are less frequent. Maxalt continues to help, but causes drowsiness so she can't take it during the day.  Neck PT did help reduce her neck pain temporarily. The pain and tension has returned since she stopped PT. She does have exercises at home that she can do when the pain gets bad.  Headache days per month: 2 Headache free days per month: 28  Current Headache Regimen: Preventative: none Abortive: Maxalt 10 mg PRN  Prior Therapies                                  Topamax 100 mg QHS  Maxalt 10 mg PRN - drowsiness  Physical Exam:   Vital Signs: BP 140/82 (BP Location: Right Arm, Patient Position: Sitting)   Pulse 76   Ht 5\' 7"  (1.702 m)   Wt 257 lb (116.6 kg)   LMP 04/27/2022 (Approximate)   BMI 40.25 kg/m  GENERAL:  well appearing, in no acute distress, alert  SKIN:  Color, texture, turgor normal. No rashes or lesions HEAD:  Normocephalic/atraumatic. RESP: normal respiratory effort MSK:  No gross joint deformities.   NEUROLOGICAL: Mental Status: Alert, oriented to person, place and time, Follows commands, and Speech fluent and appropriate. Cranial Nerves: PERRL, face symmetric, no dysarthria, hearing grossly intact Motor: moves all extremities equally Gait: normal-based.  IMPRESSION: 21 year old female with a history of asthma who presents for follow up of migraines. Her headaches have improved since graduating college, and she has been able to stop Topamax. Maxalt does help but makes her drowsy so she is unable to take  it during the day. Will start Zomig for rescue as this may be less sedating.  PLAN: -Rescue: Start Zomig 5 mg PRN   Follow-up: 6 months  I spent a total of 14 minutes on the date of the service. Headache education was done. Discussed acute medications. Discussed medication side effects, adverse reactions and drug interactions. Written educational materials and patient instructions outlining all of the above were given.  21, MD 05/11/22 2:55 PM

## 2022-05-15 ENCOUNTER — Other Ambulatory Visit: Payer: Self-pay | Admitting: Psychiatry

## 2022-05-15 ENCOUNTER — Telehealth: Payer: Self-pay | Admitting: *Deleted

## 2022-05-15 MED ORDER — SUMATRIPTAN SUCCINATE 50 MG PO TABS: 50.0000 mg | ORAL_TABLET | ORAL | 6 refills | Status: AC | PRN

## 2022-05-15 NOTE — Telephone Encounter (Signed)
Received fax from [place], zolmitriptan not covered. Preferred are sumatriptan tab, rizatriptan. Patient has tried Scientist, clinical (histocompatibility and immunogenetics). Will send to MD.

## 2022-05-15 NOTE — Telephone Encounter (Signed)
Rx for sumatriptan sent, [name]

## 2022-05-17 ENCOUNTER — Encounter: Payer: Self-pay | Admitting: Nurse Practitioner

## 2022-05-17 ENCOUNTER — Ambulatory Visit (INDEPENDENT_AMBULATORY_CARE_PROVIDER_SITE_OTHER): Payer: Medicaid Other | Admitting: Nurse Practitioner

## 2022-05-17 VITALS — BP 141/82 | HR 85 | Temp 97.8°F | Ht 67.0 in | Wt 252.6 lb

## 2022-05-17 DIAGNOSIS — B379 Candidiasis, unspecified: Secondary | ICD-10-CM | POA: Diagnosis not present

## 2022-05-17 NOTE — Progress Notes (Signed)
@Patient  ID: , female    DOB: [birth year], 21 y.o.   MRN: [id number]  Chief Complaint  Patient presents with   Vaginal Itching    Referring provider: [id number], NP   HPI  Patient presents today with vaginal irritation.  She states that this started several days ago.  We discussed that we can do a vaginal swab.  She states that this feels like a yeast infection.  We can treat with Diflucan. Denies f/c/s, n/v/d, hemoptysis, PND, leg swelling Denies chest pain or edema     No Known Allergies   There is no immunization history on file for this patient.  Past Medical History:  Diagnosis Date   Asthma     Tobacco History: Social History   Tobacco Use  Smoking Status Never  Smokeless Tobacco Never   Counseling given: Not Answered   Outpatient Encounter Medications as of 05/17/2022  Medication Sig   albuterol (VENTOLIN HFA) 108 (90 Base) MCG/ACT inhaler Inhale 2 puffs into the lungs every 6 (six) hours as needed for wheezing or shortness of breath.   budesonide-formoterol (SYMBICORT) 80-4.5 MCG/ACT inhaler Inhale 2 puffs into the lungs 2 (two) times daily.   SPRINTEC 28 0.25-35 MG-MCG tablet Take 1 tablet by mouth daily.   SUMAtriptan (IMITREX) 50 MG tablet Take 1 tablet (50 mg total) by mouth as needed for migraine. May repeat in 2 hours if headache persists or recurs. Max dose 2 pills in 24 hours   montelukast (SINGULAIR) 10 MG tablet Take 1 tablet (10 mg total) by mouth at bedtime. (Patient not taking: Reported on 05/11/2022)   ondansetron (ZOFRAN-ODT) 4 MG disintegrating tablet Take 1 tablet (4 mg total) by mouth every 8 (eight) hours as needed for nausea or vomiting. (Patient not taking: Reported on 02/23/2022)   topiramate (TOPAMAX) 25 MG tablet Take 50 mg (2 pills) at bedtime for one week, then take 75 mg (3 pills) at bedtime for one week, then take 100 mg (4 pills) at bedtime (Patient not taking: Reported on 05/11/2022)   No  facility-administered encounter medications on file as of 05/17/2022.     Review of Systems  Review of Systems  Constitutional: Negative.   HENT: Negative.    Cardiovascular: Negative.   Gastrointestinal: Negative.   Genitourinary:  Positive for vaginal discharge.  Allergic/Immunologic: Negative.   Neurological: Negative.   Psychiatric/Behavioral: Negative.         Physical Exam  BP (!) 141/82   Pulse 85   Temp 97.8 F (36.6 C)   Ht 5\' 7"  (1.702 m)   Wt 252 lb 9.6 oz (114.6 kg)   LMP 04/27/2022 (Approximate)   BMI 39.56 kg/m   Wt Readings from Last 5 Encounters:  05/17/22 252 lb 9.6 oz (114.6 kg)  05/11/22 257 lb (116.6 kg)  04/21/22 258 lb (117 kg)  03/24/22 255 lb (115.7 kg)  02/23/22 253 lb 0.2 oz (114.8 kg)     Physical Exam Vitals and nursing note reviewed.  Constitutional:      General: She is not in acute distress.    Appearance: She is well-developed.  Cardiovascular:     Rate and Rhythm: Normal rate and regular rhythm.  Pulmonary:     Effort: Pulmonary effort is normal.     Breath sounds: Normal breath sounds.  Neurological:     Mental Status: She is alert and oriented to person, place, and time.      Lab Results:  CBC    Component Value  Date/Time   WBC 6.8 03/24/2022 1541   WBC 6.9 10/19/2021 0226   RBC 4.63 03/24/2022 1541   RBC 4.48 10/19/2021 0226   HGB 13.3 03/24/2022 1541   HCT 39.4 03/24/2022 1541   PLT 278 03/24/2022 1541   MCV 85 03/24/2022 1541   MCH 28.7 03/24/2022 1541   MCH 28.6 10/19/2021 0226   MCHC 33.8 03/24/2022 1541   MCHC 32.6 10/19/2021 0226   RDW 11.8 03/24/2022 1541    BMET    Component Value Date/Time   NA 141 03/24/2022 1541   K 4.9 03/24/2022 1541   CL 104 03/24/2022 1541   CO2 26 03/24/2022 1541   GLUCOSE 94 03/24/2022 1541   GLUCOSE 133 (H) 10/19/2021 0226   BUN 11 03/24/2022 1541   CREATININE 0.84 03/24/2022 1541   CALCIUM 9.3 03/24/2022 1541   GFRNONAA >60 10/19/2021 0226     Assessment &  Plan:   Yeast infection - NuSwab Vaginitis Plus (VG+)   Follow up as needed     Ivonne Andrew, NP 05/26/2022

## 2022-05-19 ENCOUNTER — Other Ambulatory Visit: Payer: Self-pay | Admitting: Nurse Practitioner

## 2022-05-19 MED ORDER — FLUCONAZOLE 150 MG PO TABS: 150.0000 mg | ORAL_TABLET | Freq: Every day | ORAL | 0 refills | Status: AC

## 2022-05-20 LAB — NUSWAB VAGINITIS PLUS (VG+)
Candida albicans, NAA: POSITIVE — AB
Candida glabrata, NAA: NEGATIVE
Chlamydia trachomatis, NAA: NEGATIVE
Neisseria gonorrhoeae, NAA: NEGATIVE
Trich vag by NAA: NEGATIVE

## 2022-05-26 ENCOUNTER — Encounter: Payer: Self-pay | Admitting: Nurse Practitioner

## 2022-05-26 DIAGNOSIS — B379 Candidiasis, unspecified: Secondary | ICD-10-CM | POA: Insufficient documentation

## 2022-05-26 NOTE — Assessment & Plan Note (Signed)
-   NuSwab Vaginitis Plus (VG+)   Follow up as needed

## 2022-05-26 NOTE — Patient Instructions (Addendum)
1. Yeast infection  - NuSwab Vaginitis Plus (VG+)   Follow up as needed

## 2023-02-09 IMAGING — CT CT HEAD W/O CM
3 series · 15 of 47 positions shown, 18 images · non-contrast
Comparison: None.

CLINICAL DATA: Headache

EXAM:
CT HEAD WITHOUT CONTRAST
TECHNIQUE: Contiguous axial images were obtained from the base of the skull
through the vertex without intravenous contrast.

[Series 2: head wo · axial · 0.47mm/px · z∈[+1562,+1692]mm · 9 of 32 slices shown, 12 images]
[im 3/32  brain]
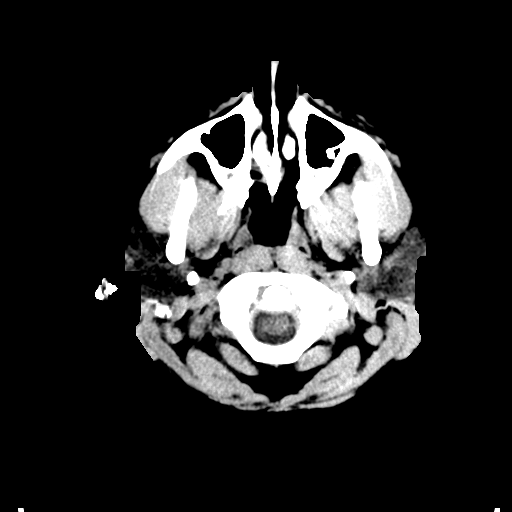
[im 3/32  bone]
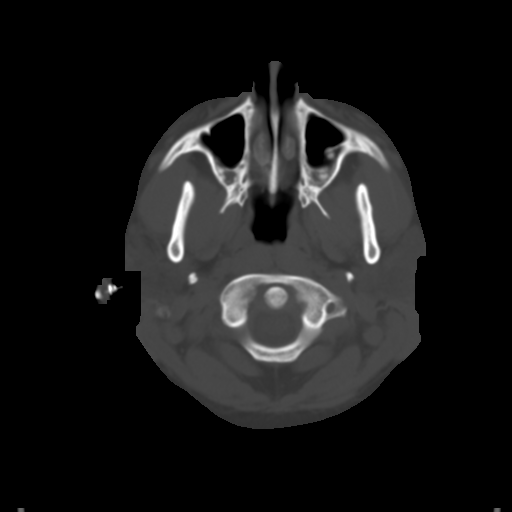
[im 6/32  brain]
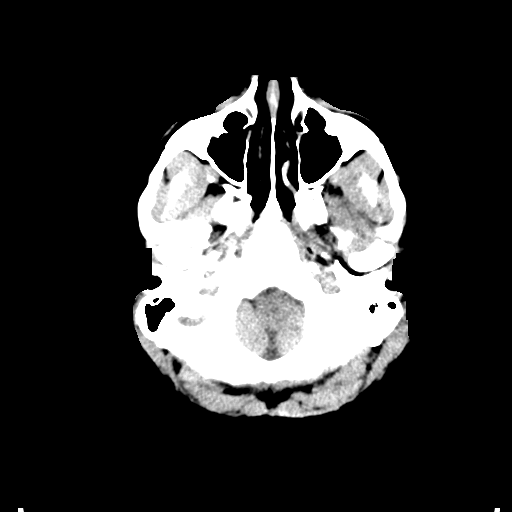
[im 9/32  brain]
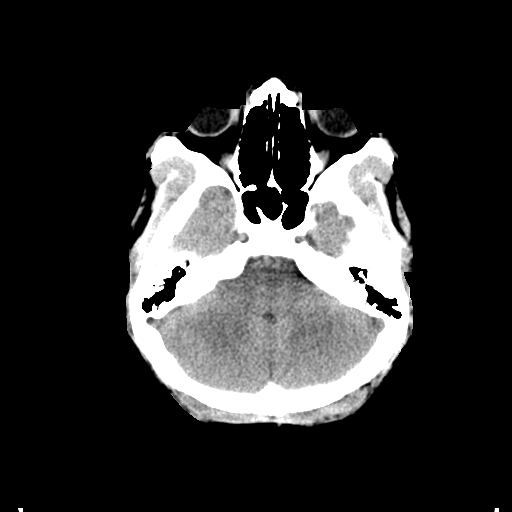
[im 12/32  brain]
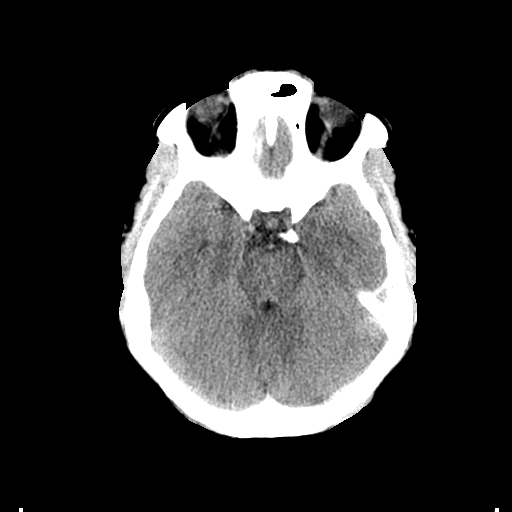
[im 17/32  brain]
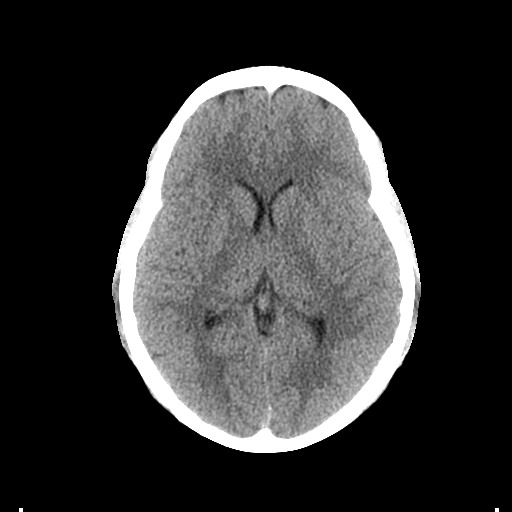
[im 17/32  bone]
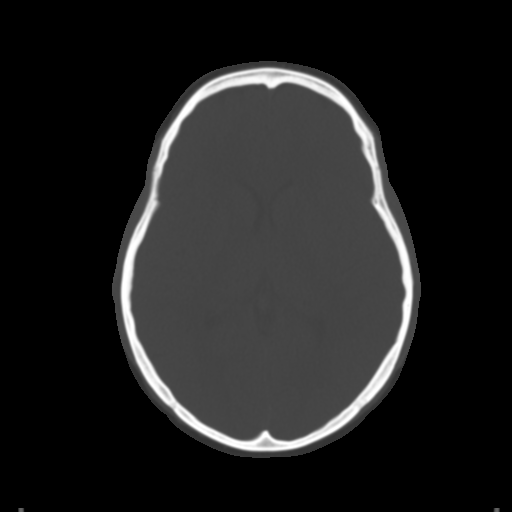
[im 20/32  brain]
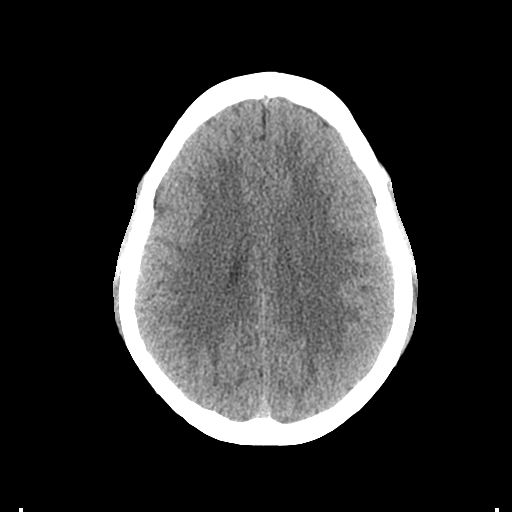
[im 23/32  brain]
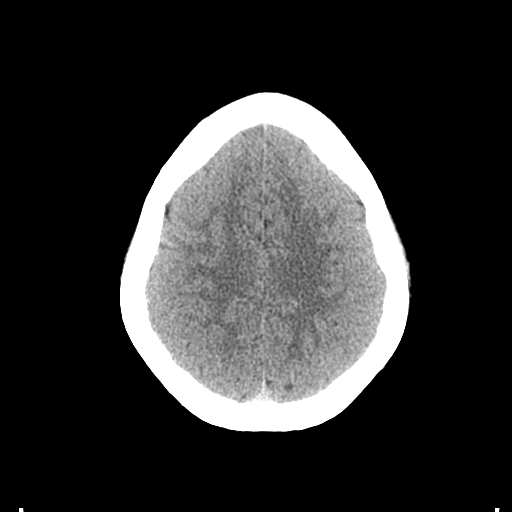
[im 26/32  brain]
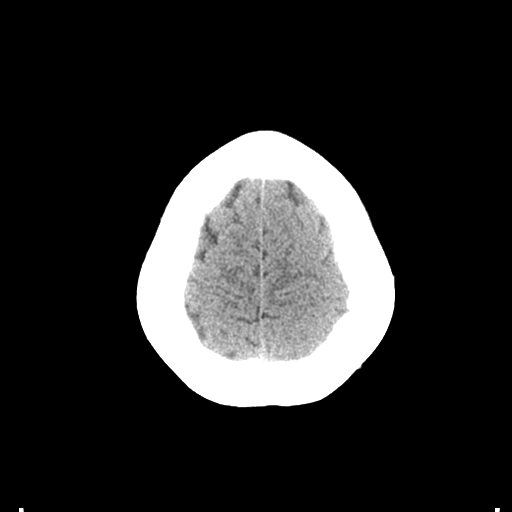
[im 29/32  brain]
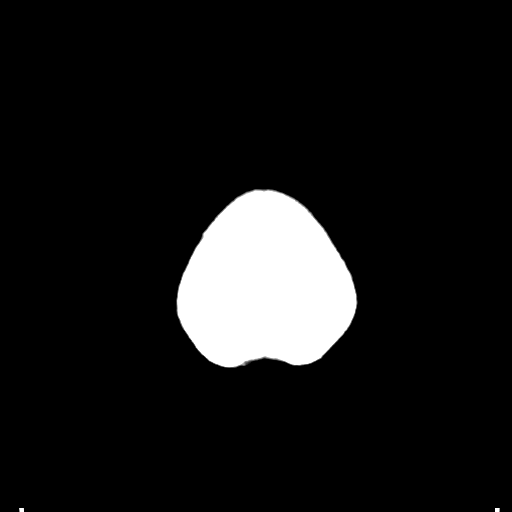
[im 29/32  bone]
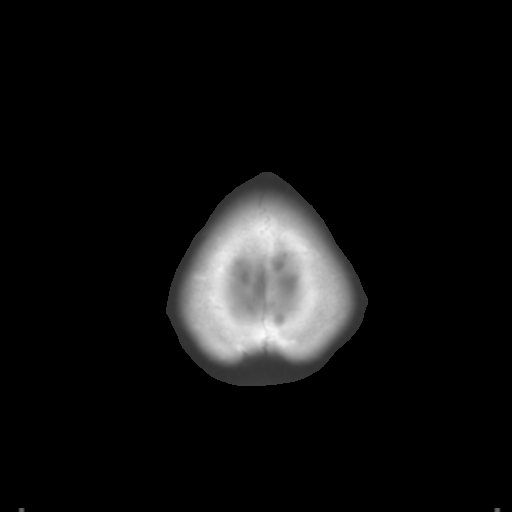

[Series 5: coronal soft tissue · coronal · 0.31mm/px · 3 of 72 slices shown]
[im 24/72  brain]
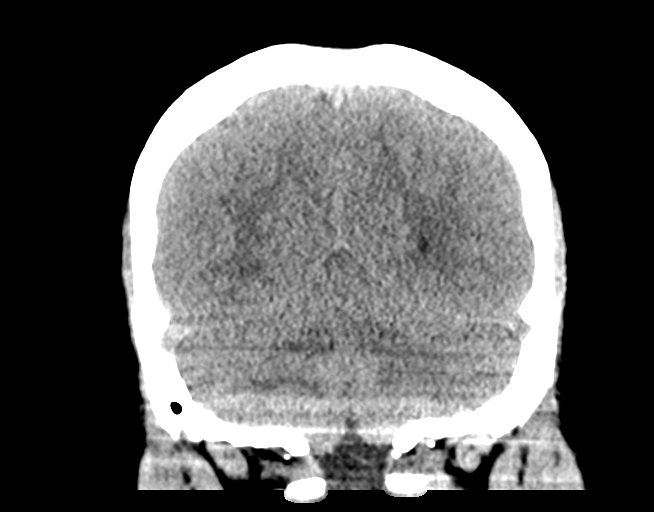
[im 32/72  brain]
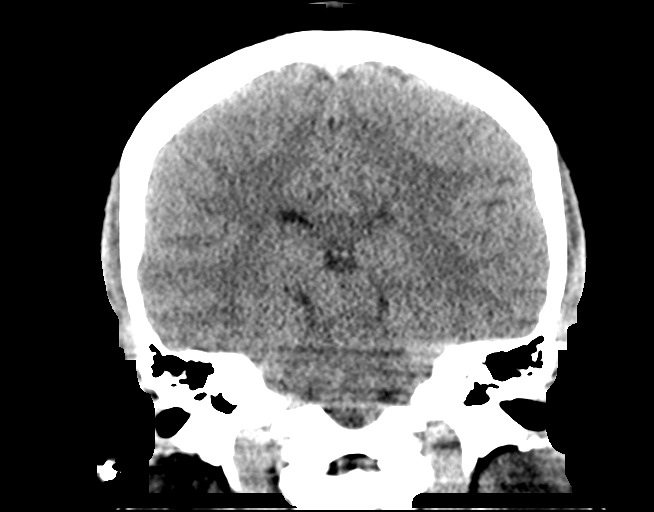
[im 40/72  brain]
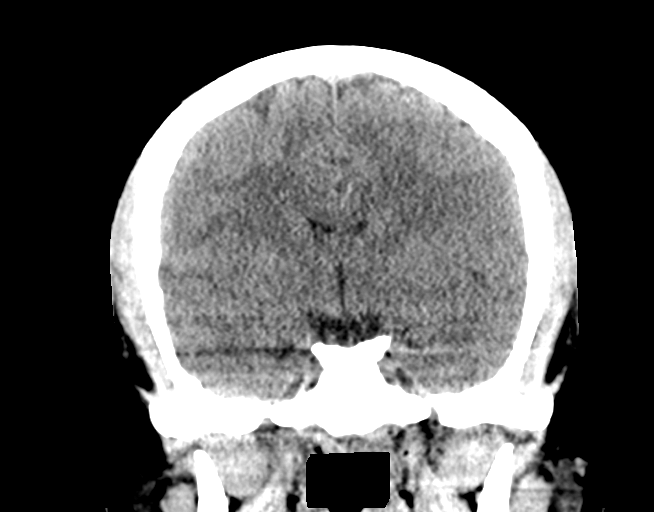

[Series 6: sagittal soft tissue · sagittal · 0.30mm/px · 3 of 69 slices shown]
[im 23/69  brain]
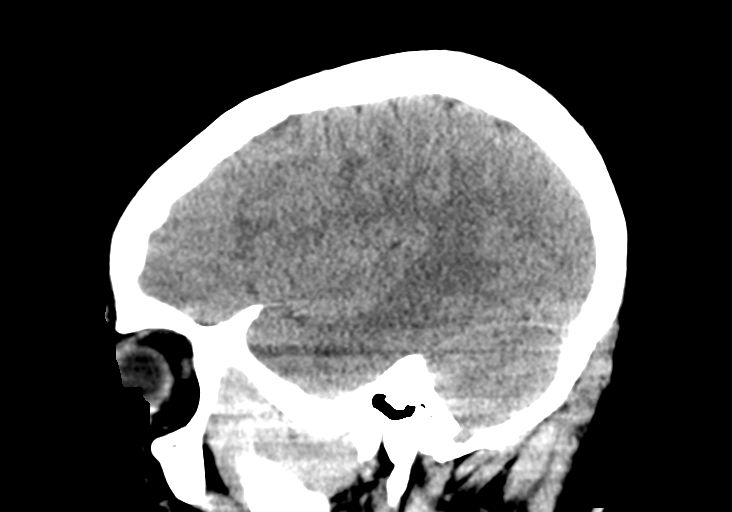
[im 35/69  brain]
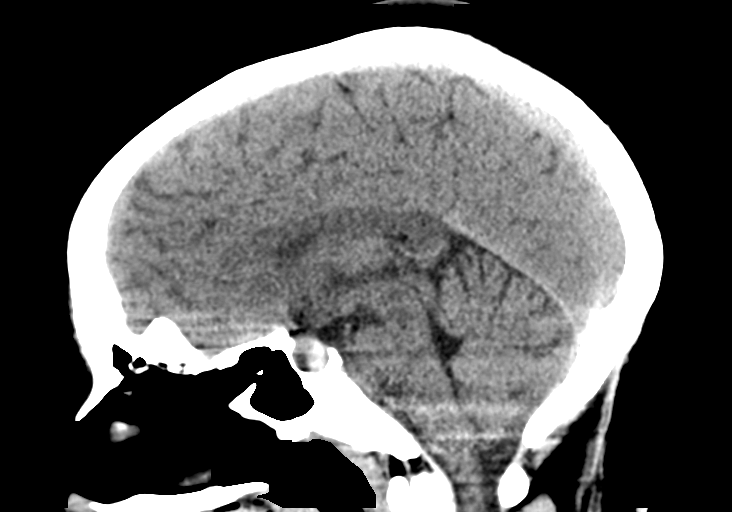
[im 46/69  brain]
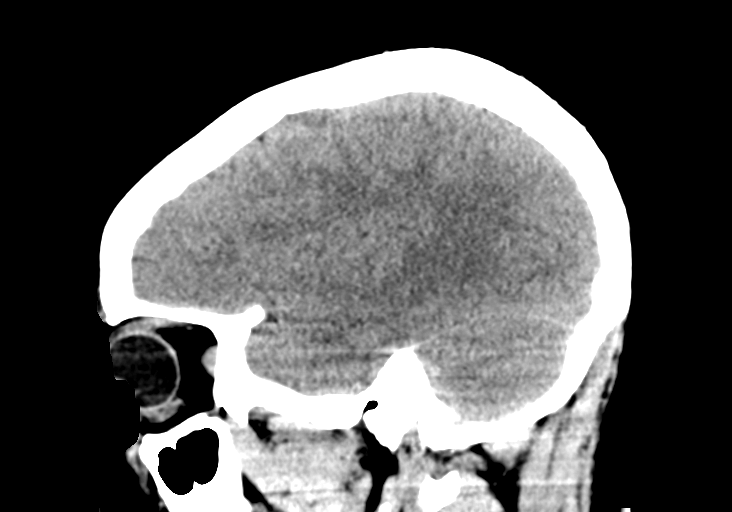

[15 of 47 positions shown; findings below may reference images not displayed]

FINDINGS: Brain: No evidence of acute infarction, hemorrhage, hydrocephalus,
extra-axial collection or mass lesion/mass effect.

Vascular: No hyperdense vessel or unexpected calcification.

Skull: Normal. Negative for fracture or focal lesion.

Sinuses/Orbits: The visualized paranasal sinuses are essentially
clear. The mastoid air cells are unopacified.

Other: None.
IMPRESSION: Normal head CT.

## 2023-05-01 ENCOUNTER — Telehealth: Payer: Self-pay | Admitting: Family Medicine

## 2023-05-01 NOTE — Telephone Encounter (Signed)
LVM and sent MyChart message informing pt of r/s needed for 6/17 appt- NP out.

## 2023-05-14 ENCOUNTER — Ambulatory Visit: Payer: Medicaid Other | Admitting: Psychiatry

## 2023-05-14 ENCOUNTER — Ambulatory Visit: Payer: Medicaid Other | Admitting: Family Medicine
